# Patient Record
Sex: Male | Born: 1995 | Race: White | Hispanic: No | Marital: Single | Smoking: Current every day smoker
Health system: Southern US, Community
[De-identification: ages and names within clinical notes are randomized; demographics above are authoritative.]

## PROBLEM LIST (undated history)

## (undated) DIAGNOSIS — F1910 Other psychoactive substance abuse, uncomplicated: Secondary | ICD-10-CM

## (undated) DIAGNOSIS — R569 Unspecified convulsions: Secondary | ICD-10-CM

## (undated) HISTORY — DX: Unspecified convulsions: R56.9

---
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
## 2014-10-14 ENCOUNTER — Emergency Department (HOSPITAL_COMMUNITY): Payer: Managed Care, Other (non HMO)

## 2014-10-14 ENCOUNTER — Encounter (HOSPITAL_COMMUNITY): Payer: Self-pay | Admitting: Emergency Medicine

## 2014-10-14 ENCOUNTER — Emergency Department (HOSPITAL_COMMUNITY)
Admission: EM | Admit: 2014-10-14 | Discharge: 2014-10-14 | Disposition: A | Discharge status: Home / Self Care | Payer: Managed Care, Other (non HMO) | Attending: Emergency Medicine | Admitting: Emergency Medicine

## 2014-10-14 DIAGNOSIS — Y999 Unspecified external cause status: Secondary | ICD-10-CM | POA: Diagnosis not present

## 2014-10-14 DIAGNOSIS — Z792 Long term (current) use of antibiotics: Secondary | ICD-10-CM | POA: Diagnosis not present

## 2014-10-14 DIAGNOSIS — Y929 Unspecified place or not applicable: Secondary | ICD-10-CM | POA: Insufficient documentation

## 2014-10-14 DIAGNOSIS — W06XXXA Fall from bed, initial encounter: Secondary | ICD-10-CM | POA: Insufficient documentation

## 2014-10-14 DIAGNOSIS — R569 Unspecified convulsions: Secondary | ICD-10-CM | POA: Insufficient documentation

## 2014-10-14 DIAGNOSIS — Y939 Activity, unspecified: Secondary | ICD-10-CM | POA: Insufficient documentation

## 2014-10-14 DIAGNOSIS — G479 Sleep disorder, unspecified: Secondary | ICD-10-CM | POA: Insufficient documentation

## 2014-10-14 DIAGNOSIS — W19XXXA Unspecified fall, initial encounter: Secondary | ICD-10-CM

## 2014-10-14 DIAGNOSIS — Z720 Tobacco use: Secondary | ICD-10-CM | POA: Insufficient documentation

## 2014-10-14 DIAGNOSIS — M25511 Pain in right shoulder: Secondary | ICD-10-CM

## 2014-10-14 DIAGNOSIS — S4991XA Unspecified injury of right shoulder and upper arm, initial encounter: Secondary | ICD-10-CM | POA: Diagnosis not present

## 2014-10-14 HISTORY — DX: Unspecified convulsions: R56.9

## 2014-10-14 LAB — CBC
HCT: 41.5 % (ref 39.0–52.0)
Hemoglobin: 13.9 g/dL (ref 13.0–17.0)
MCH: 29.3 pg (ref 26.0–34.0)
MCHC: 33.5 g/dL (ref 30.0–36.0)
MCV: 87.4 fL (ref 78.0–100.0)
Platelets: 324 10*3/uL (ref 150–400)
RBC: 4.75 MIL/uL (ref 4.22–5.81)
RDW: 13 % (ref 11.5–15.5)
WBC: 10.1 10*3/uL (ref 4.0–10.5)

## 2014-10-14 LAB — BASIC METABOLIC PANEL
Anion gap: 9 (ref 5–15)
BUN: 9 mg/dL (ref 6–20)
CO2: 24 mmol/L (ref 22–32)
Calcium: 8.9 mg/dL (ref 8.9–10.3)
Chloride: 105 mmol/L (ref 101–111)
Creatinine, Ser: 0.83 mg/dL (ref 0.61–1.24)
GFR calc Af Amer: >60 mL/min (ref >60)
GFR calc non Af Amer: >60 mL/min (ref >60)
Glucose, Bld: 109 mg/dL — ABNORMAL HIGH (ref 65–99)
Potassium: 4.4 mmol/L (ref 3.5–5.1)
Sodium: 138 mmol/L (ref 135–145)

## 2014-10-14 NOTE — ED Notes (Addendum)
Pt reports he has been abusing xanax, IV heroin and has been snorting cocaine. Last use was 2 weeks ago.

## 2014-10-14 NOTE — ED Provider Notes (Signed)
CSN: [id number]     Arrival date & time 10/14/14  0009 History   First MD Initiated Contact with Patient 10/14/14 0047     This chart was scribed for Mirian Mo, MD by Arlan Organ, ED Scribe. This patient was seen in room B18C/B18C and the patient's care was started 1:27 AM.   Chief Complaint  Patient presents with  . Seizures   The history is provided by the patient. No language interpreter was used.    HPI Comments: Adrian Schmitt is a 19 y.o. male without any pertinent past medical history who presents to the Emergency Department here after a possible seizure this evening. Pt family found pt on the floor after he fell from his bed at approximately 11:30. Pt states he can not recall any incidents prior to falling asleep this evening. He now c/o constant ongoing R arm pain. No OTC medications or home remedies attempted prior to arrival. Last seizure 8 years ago. No recent fever, chills, nausea, cough, congestion, sore throat, or vomiting. He is not currently on any prescribed medications for history of seizures. Pt admits to abusing Xanax and IV heroin/Cocaine use, however not in presence of mother. Last use 2 weeks ago. No known allergies to medications.  Past Medical History  Diagnosis Date  . Seizures   . Seizure    History reviewed. No pertinent past surgical history. No family history on file. History  Substance Use Topics  . Smoking status: Current Every Day Smoker  . Smokeless tobacco: Not on file  . Alcohol Use: Yes    Review of Systems  Constitutional: Negative for fever and chills.  HENT: Negative for congestion and sore throat.   Respiratory: Negative for cough and shortness of breath.   Cardiovascular: Negative for chest pain.  Gastrointestinal: Negative for nausea and vomiting.  Musculoskeletal: Positive for arthralgias.  Neurological: Positive for seizures.  Psychiatric/Behavioral: Positive for sleep disturbance.  All other systems reviewed and are  negative.     Allergies  Ativan and Nembutal  Home Medications   Prior to Admission medications   Medication Sig Start Date End Date Taking? Authorizing Provider  silver sulfADIAZINE (SILVADENE) 1 % cream Apply 1 application topically daily. 08/16/13   Elvina Sidle, MD   Triage Vitals: BP 131/58 mmHg  Pulse 58  Temp(Src) 98.3 F (36.8 C) (Oral)  Resp 26  SpO2 100%   Physical Exam  Constitutional: He is oriented to person, place, and time. He appears well-developed and well-nourished.  HENT:  Head: Normocephalic and atraumatic.  Eyes: Conjunctivae and EOM are normal.  Neck: Normal range of motion. Neck supple.  Cardiovascular: Normal rate, regular rhythm and normal heart sounds.   Pulmonary/Chest: Effort normal and breath sounds normal. No respiratory distress.  Abdominal: He exhibits no distension. There is no tenderness. There is no rebound and no guarding.  Musculoskeletal: Normal range of motion.  Neurological: He is alert and oriented to person, place, and time. He has normal strength and normal reflexes. No cranial nerve deficit or sensory deficit. Coordination normal. GCS eye subscore is 4. GCS verbal subscore is 5. GCS motor subscore is 6.  Skin: Skin is warm and dry.  Vitals reviewed.   ED Course  Procedures (including critical care time)  DIAGNOSTIC STUDIES: Oxygen Saturation is 98% on RA, Normal by my interpretation.    COORDINATION OF CARE: 1:30 AM- Will order BMP and CBC. Discussed treatment plan with pt at bedside and pt agreed to plan.     Labs  Review Labs Reviewed  BASIC METABOLIC PANEL - Abnormal; Notable for the following:    Glucose, Bld 109 (*)    All other components within normal limits  CBC    Imaging Review Dg Shoulder Right  10/14/2014   CLINICAL DATA:  Right shoulder pain after falling out bed during a seizure  EXAM: RIGHT SHOULDER - 2+ VIEW  COMPARISON:  None.  FINDINGS: There is no evidence of fracture or dislocation. There is no  evidence of arthropathy or other focal bone abnormality. Soft tissues are unremarkable.  IMPRESSION: Negative.   Electronically Signed   By: Ellery Plunk M.D.   On: 10/14/2014 02:11   Dg Humerus Right  10/14/2014   CLINICAL DATA:  Right upper extremity pain after falling out of bed during a seizure  EXAM: RIGHT HUMERUS - 2+ VIEW  COMPARISON:  None.  FINDINGS: There is no evidence of fracture or other focal bone lesions. Soft tissues are unremarkable.  IMPRESSION: Negative.   Electronically Signed   By: Ellery Plunk M.D.   On: 10/14/2014 02:11     EKG Interpretation None      MDM   Final diagnoses:  Right shoulder pain  Fall, initial encounter    19 y.o. male with pertinent PMH of prior seizures presents with ams after fall from bed.  Pt does endorse ho IV drug abuse, however no recent fever or other illness.  Physical exam on arrival benign, no seizure like activity reported, and pt was only drowsy for a brief period of time after parents found him on the floor after a thump.  Wu unremarkable.  Likely recurrent seizure vs illicit substance abuse.  DC home to fu with neurology.    I have reviewed all laboratory and imaging studies if ordered as above  1. Fall, initial encounter   2. Right shoulder pain           Mirian Mo, MD 10/14/14 [phone]

## 2014-10-14 NOTE — ED Notes (Signed)
Pt.'s family found him on the floor fell from bed , ? Seizure episode , pt. stated he had " twitchings" when we went to bed this evening , alert and oriented at arrival / respirations unlabored , reports pain at right upper arm and left wrist.

## 2014-10-14 NOTE — ED Notes (Signed)
Pt A&OX4, ambulatory at d/c with steady gait, NAD 

## 2014-10-14 NOTE — Discharge Instructions (Signed)
Contusion A contusion is a deep bruise. Contusions are the result of an injury that caused bleeding under the skin. The contusion may turn blue, purple, or yellow. Minor injuries will give you a painless contusion, but more severe contusions may stay painful and swollen for a few weeks.  CAUSES  A contusion is usually caused by a blow, trauma, or direct force to an area of the body. SYMPTOMS   Swelling and redness of the injured area.  Bruising of the injured area.  Tenderness and soreness of the injured area.  Pain. DIAGNOSIS  The diagnosis can be made by taking a history and physical exam. An X-ray, CT scan, or MRI may be needed to determine if there were any associated injuries, such as fractures. TREATMENT  Specific treatment will depend on what area of the body was injured. In general, the best treatment for a contusion is resting, icing, elevating, and applying cold compresses to the injured area. Over-the-counter medicines may also be recommended for pain control. Ask your caregiver what the best treatment is for your contusion. HOME CARE INSTRUCTIONS   Put ice on the injured area.  Put ice in a plastic bag.  Place a towel between your skin and the bag.  Leave the ice on for 15-20 minutes, 3-4 times a day, or as directed by your health care provider.  Only take over-the-counter or prescription medicines for pain, discomfort, or fever as directed by your caregiver. Your caregiver may recommend avoiding anti-inflammatory medicines (aspirin, ibuprofen, and naproxen) for 48 hours because these medicines may increase bruising.  Rest the injured area.  If possible, elevate the injured area to reduce swelling. SEEK IMMEDIATE MEDICAL CARE IF:   You have increased bruising or swelling.  You have pain that is getting worse.  Your swelling or pain is not relieved with medicines. MAKE SURE YOU:   Understand these instructions.  Will watch your condition.  Will get help right  away if you are not doing well or get worse. Document Released: 01/30/2005 Document Revised: 04/27/2013 Document Reviewed: 02/25/2011 [facility] Patient Information 2015 [place], [place]. This information is not intended to replace advice given to you by your health care provider. Make sure you discuss any questions you have with your health care provider. Seizure, Adult A seizure is abnormal electrical activity in the brain. Seizures usually last from 30 seconds to 2 minutes. There are various types of seizures. Before a seizure, you may have a warning sensation (aura) that a seizure is about to occur. An aura may include the following symptoms:   Fear or anxiety.  Nausea.  Feeling like the room is spinning (vertigo).  Vision changes, such as seeing flashing lights or spots. Common symptoms during a seizure include:  A change in attention or behavior (altered mental status).  Convulsions with rhythmic jerking movements.  Drooling.  Rapid eye movements.  Grunting.  Loss of bladder and bowel control.  Bitter taste in the mouth.  Tongue biting. After a seizure, you may feel confused and sleepy. You may also have an injury resulting from convulsions during the seizure. HOME CARE INSTRUCTIONS   If you are given medicines, take them exactly as prescribed by your health care provider.  Keep all follow-up appointments as directed by your health care provider.  Do not swim or drive or engage in risky activity during which a seizure could cause further injury to you or others until your health care provider says it is OK.  Get adequate rest.  Teach  friends and family what to do if you have a seizure. They should:  Lay you on the ground to prevent a fall.  Put a cushion under your head.  Loosen any tight clothing around your neck.  Turn you on your side. If vomiting occurs, this helps keep your airway clear.  Stay with you until you recover.  Know whether or not you need  emergency care. SEEK IMMEDIATE MEDICAL CARE IF:  The seizure lasts longer than 5 minutes.  The seizure is severe or you do not wake up immediately after the seizure.  You have an altered mental status after the seizure.  You are having more frequent or worsening seizures. Someone should drive you to the emergency department or call local emergency services (911 in [place] MAKE SURE YOU:  Understand these instructions.  Will watch your condition.  Will get help right away if you are not doing well or get worse. Document Released: 04/19/2000 Document Revised: 02/10/2013 Document Reviewed: 12/02/2012 [facility] Patient Information 2015 [place], [place]. This information is not intended to replace advice given to you by your health care provider. Make sure you discuss any questions you have with your health care provider.

## 2014-10-14 NOTE — ED Notes (Signed)
Mother reports pt has a hx of seizures, his last seizure was 8 years ago and does not take medication nor does he see a doctor anymore for his seizures.

## 2015-03-13 ENCOUNTER — Emergency Department (HOSPITAL_COMMUNITY)
Admission: EM | Admit: 2015-03-13 | Discharge: 2015-03-13 | Disposition: A | Discharge status: Home / Self Care | Payer: Managed Care, Other (non HMO) | Attending: Emergency Medicine | Admitting: Emergency Medicine

## 2015-03-13 ENCOUNTER — Encounter (HOSPITAL_COMMUNITY): Payer: Self-pay | Admitting: Emergency Medicine

## 2015-03-13 DIAGNOSIS — Y998 Other external cause status: Secondary | ICD-10-CM | POA: Insufficient documentation

## 2015-03-13 DIAGNOSIS — Y9389 Activity, other specified: Secondary | ICD-10-CM | POA: Insufficient documentation

## 2015-03-13 DIAGNOSIS — Z720 Tobacco use: Secondary | ICD-10-CM | POA: Insufficient documentation

## 2015-03-13 DIAGNOSIS — Y9289 Other specified places as the place of occurrence of the external cause: Secondary | ICD-10-CM | POA: Diagnosis not present

## 2015-03-13 DIAGNOSIS — T401X1A Poisoning by heroin, accidental (unintentional), initial encounter: Secondary | ICD-10-CM | POA: Insufficient documentation

## 2015-03-13 HISTORY — DX: Other psychoactive substance abuse, uncomplicated: F19.10

## 2015-03-13 NOTE — ED Provider Notes (Signed)
CSN: [id number]     Arrival date & time 03/13/15  1349 History   First MD Initiated Contact with Patient 03/13/15 1500     Chief Complaint  Patient presents with  . Drug Overdose     (Consider location/radiation/quality/duration/timing/severity/associated sxs/prior Treatment) Patient is a 19 y.o. male presenting with Overdose.  Drug Overdose This is a new problem. The current episode started 1 to 2 hours ago. The problem occurs constantly. The problem has not changed since onset.Pertinent negatives include no chest pain, no abdominal pain, no headaches and no shortness of breath. Nothing aggravates the symptoms. Nothing relieves the symptoms.    Past Medical History  Diagnosis Date  . Seizures (HCC)   . Seizure (HCC)   . Polysubstance abuse    No past surgical history on file. No family history on file. Social History  Substance Use Topics  . Smoking status: Current Every Day Smoker  . Smokeless tobacco: None  . Alcohol Use: Yes    Review of Systems  Respiratory: Negative for shortness of breath.   Cardiovascular: Negative for chest pain.  Gastrointestinal: Negative for abdominal pain.  Neurological: Negative for headaches.  All other systems reviewed and are negative.     Allergies  Ativan and Nembutal  Home Medications   Prior to Admission medications   Medication Sig Start Date End Date Taking? Authorizing Provider  silver sulfADIAZINE (SILVADENE) 1 % cream Apply 1 application topically daily. Patient not taking: Reported on 03/13/2015 08/16/13   Elvina SidleKurt Lauenstein, MD   BP 144/73 mmHg  Pulse 102  Temp(Src) 97.9 F (36.6 C) (Axillary)  Resp 19  SpO2 100% Physical Exam  Constitutional: He is oriented to person, place, and time. He appears well-developed and well-nourished.  HENT:  Head: Normocephalic and atraumatic.  Eyes: Conjunctivae and EOM are normal.  Neck: Normal range of motion. Neck supple.  Cardiovascular: Normal rate, regular rhythm and normal  heart sounds.   Pulmonary/Chest: Effort normal and breath sounds normal. No respiratory distress.  Abdominal: He exhibits no distension. There is no tenderness. There is no rebound and no guarding.  Musculoskeletal: Normal range of motion.  Neurological: He is alert and oriented to person, place, and time.  Skin: Skin is warm and dry.  Vitals reviewed.   ED Course  Procedures (including critical care time) Labs Review Labs Reviewed - No data to display  Imaging Review No results found. I have personally reviewed and evaluated these images and lab results as part of my medical decision-making.   EKG Interpretation None      MDM   Final diagnoses:  Heroin overdose, accidental or unintentional, initial encounter    19 y.o. male with pertinent PMH of heroin abuse presents with overdose on heroin.  States he took more than usual.  Father heard the pt hit the ground.  Narcan alleviated symptoms.  On my exam pt drowsy, but not sleeping.  Exam benign.  Monitored 4 hours from administration of narcan without recurrence.  Parents to take pt home.  Standard return precautions given.  DC home in stable condition  I have reviewed all laboratory and imaging studies if ordered as above  1. Heroin overdose, accidental or unintentional, initial encounter         Mirian MoMatthew Gentry, MD 03/13/15 [phone]

## 2015-03-13 NOTE — Progress Notes (Addendum)
Pt noted to doze off and on when speaking with Cm Pt confirms [employer] coverage but not aware of where insurance card is Parents arrived in pt room [facility] ED CM noted pt with coverage  [facility] ED CM spoke with parrents on how to obtain an in network pcp with insurance coverage via the customer service number or web site  Cm reviewed ED level of care for crisis/emergent services and community pcp level of care to manage continuous or chronic medical concerns.  The pt voiced understanding CM encouraged pt and discussed pt's responsibility to verify with pt's insurance carrier that any recommended medical provider offered by any emergency room or a hospital provider is within the carrier's network. The pt voiced understanding   When CM left room and stated she hoped pt felt better, his mother snickered in a negative way.

## 2015-03-13 NOTE — Discharge Instructions (Signed)
Narcotic Overdose A narcotic overdose is the misuse or overuse of a narcotic drug. A narcotic overdose can make you pass out and stop breathing. If you are not treated right away, this can cause permanent brain damage or stop your heart. Medicine may be given to reverse the effects of an overdose. If so, this medicine may bring on withdrawal symptoms. The symptoms may be abdominal cramps, throwing up (vomiting), sweating, chills, and nervousness. Injecting narcotics can cause more problems than just an overdose. AIDS, hepatitis, and other very serious infections are transmitted by sharing needles and syringes. If you decide to quit using, there are medicines which can help you through the withdrawal period. Trying to quit all at once on your own can be uncomfortable, but not life-threatening. Call your caregiver, Narcotics Anonymous, or any drug and alcohol treatment program for further help.    This information is not intended to replace advice given to you by your health care provider. Make sure you discuss any questions you have with your health care provider.   Document Released: 05/30/2004 Document Revised: 05/13/2014 Document Reviewed: 10/12/2014 Elsevier Interactive Patient Education 2016 [employer]   Emergency Department Resource Guide 1) Find a Doctor and Pay Out of Pocket Although you won't have to find out who is covered by your insurance plan, it is a good idea to ask around and get recommendations. You will then need to call the office and see if the doctor you have chosen will accept you as a new patient and what types of options they offer for patients who are self-pay. Some doctors offer discounts or will set up payment plans for their patients who do not have insurance, but you will need to ask so you aren't surprised when you get to your appointment.  2) Contact Your Local Health Department Not all health departments have doctors that can see patients for sick visits, but many  do, so it is worth a call to see if yours does. If you don't know where your local health department is, you can check in your phone book. The CDC also has a tool to help you locate your state's health department, and many state websites also have listings of all of their local health departments.  3) Find a Walk-in Clinic If your illness is not likely to be very severe or complicated, you may want to try a walk in clinic. These are popping up all over the country in pharmacies, drugstores, and shopping centers. They're usually staffed by nurse practitioners or physician assistants that have been trained to treat common illnesses and complaints. They're usually fairly quick and inexpensive. However, if you have serious medical issues or chronic medical problems, these are probably not your best option.  No Primary Care Doctor: - Call Health Connect at  [phone] - they can help you locate a primary care doctor that  accepts your insurance, provides certain services, etc. - Physician Referral Service- [phone]  Chronic Pain Problems: Organization         Address  Phone   Notes  [facility]  [phone] Patients need to be referred by their primary care doctor.   Medication Assistance: Organization         Address  Phone   Notes  [facility] [address] [place], [place] [id number] [phone] --Must be a resident of [place] -- Must have NO insurance coverage whatsoever (no Medicaid/ Medicare, etc.) -- The pt.  MUST have a primary care doctor that directs their care regularly and follows them in the community   [facility]  [phone]   [employer]  [phone]    Agencies that provide inexpensive medical care: Organization         Address  Phone   Notes  [facility]  [phone]   [facility]    [phone]   [facility] [address] [place], [place] [address] [phone]   [facility] [address], [place] [phone]   Planned Parenthood    [phone]   [facility]    [phone]   [facility]  [address], [place] Phone:  [phone], Fax:  [phone] Hours of Operation:  9 am - 6 pm, M-F.  Also accepts Medicaid/Medicare and self-pay.  [facility]  [address], [place] Phone: [phone], Fax: [phone]. Hours of Operation:  8:30 am - 5:30 pm, M-F.  Also accepts Medicaid and self-pay.  [facility] [place] [address] [place] Phone: [phone]   [facility] [address] Natasha Bence [place], [place] [phone], Ext. [phone] Mondays & Thursdays: 7-9 AM.  First 15 patients are seen on a first come, first serve basis.    Medicaid-accepting [facility] Providers:  Organization         Address  Phone   Notes  [facility] [address], [place] [phone] Also accepts self-pay patients.  [facility] [address] Laurell Josephs [place], [place]  [phone]   [facility] [address], [place] [phone]   [facility] [address], [place] [phone]   Renaye Rakers [address], [place]   [phone] Only accepts [place] patients after they have their name applied to their card.   Self-Pay (no insurance) in [facility]:  Organization         Address  Phone   Notes  Sickle Cell Patients, [facility] [address] [place], [place] [phone]   [facility] [address] [place], [place] [phone]   [facility]  [address], [place] [phone]   [facility]/Dr. Osei-Bonsu  [address] [place] or  [address], [place] [phone] Phone number for both [place] and [place] locations is the same.  [facility] [address] [place] [phone]   [facility] [address], [place] or [address] [phone]   [facility] [address] [place] [phone], phone; [phone], fax Sees patients 1st and 3rd Saturday of every month.  Must not qualify for public or private insurance (i.e. Medicaid, Medicare, [place] Health Choice, Veterans' Benefits)  Household income should be no more than 200% of the poverty level The clinic cannot treat you if you are pregnant or think you are pregnant  Sexually transmitted diseases are not treated at the clinic.    Dental Care: Organization         Address  Phone  Notes  [facility] [address], [place] [phone] Accepts children up to age 21 who are enrolled in Medicaid or [place] Health Choice; pregnant women with a Medicaid card; and children who have applied for Medicaid or [place] Health Choice, but were declined, whose parents can pay a reduced fee at time of service.  °[place] [facility] [place]  [address], [place] [phone]) [phone] Accepts children up to age 21 who are enrolled in Medicaid or [place] Health Choice; pregnant women with a Medicaid card; and children who have applied for Medicaid or [place] Health Choice, but were declined, whose parents can pay a reduced fee at time of service.  °[facility] ° [address], [place] [phone] Patients are seen by appointment only. Walk-ins are not accepted. [facility] will see patients 18 years of age and older. °Monday - Tuesday (8am-5pm) °Most Wednesdays (8:30-5pm) °$30 per visit, cash only  °[facility] ° [address], [place] [phone]) [phone] Patients are seen by appointment only. Walk-ins are not accepted. [facility] will see patients 18 years of age and older. °One Wednesday Evening (Monthly: Volunteer Based).  $30 per visit, cash only  °[facility]  [phone] for adults; Children under age 4, call Graduate Pediatric Dentistry at [phone]. Children aged 4-14, please call [phone] to request a pediatric application. ° Dental services are provided in all areas of dental care including fillings, crowns and bridges, complete and partial dentures, implants, gum treatment, root canals, and extractions. Preventive care is also provided. Treatment is provided to both adults and children. °Patients are selected via a lottery and there is often a waiting list. °  °[facility] [address], °[place] ° (336) 763-8833 [email or web address] °  °[facility] [address], [place], [place] (336)[phone], Ext. [phone] Second and Fourth Thursday of each month, opens at 6:30 AM; Clinic ends at 9 AM.  Patients are seen on a first-come first-served basis, and a limited number are seen during each clinic.  ° °[facility] ° [address], [place], [place] ([phone]) [phone]   Eligibility Requirements °You must have lived in [place], [place], or [place] counties for at least the last three months. °  You cannot be eligible for state or federal sponsored healthcare insurance, including Veterans Administration, Medicaid, or Medicare. °  You generally cannot be eligible for healthcare insurance through your employer.  °  How to apply: °Eligibility screenings are held every Tuesday and Wednesday afternoon from 1:00 pm until 4:00 pm. You do not need an appointment for the interview!  °[facility] [address], [place], [place] [phone]   °[facility]  336-342-8273   °[place]  336-703-3100   °[place]  [facility]  336-570-6415   ° °Behavioral Health Resources in the Community: °Intensive Outpatient Programs °Organization         Address  Phone  Notes  °[facility] [address], [place], [place] 336-878-6098   °[facility] [address], [place], [place] 336-832-9800   °[facility] [address], [place], [place] ° 336-882-2125   °[facility] [address],  °[place], [place] 1-800-853-5163 or 336-641-4981   °Substance Abuse Resources °Organization         Address  Phone  Notes  °[facility]   [facility]  336-882-2125   °[facility]  336-784-9470   °[facility]  336-285-9073   °[facility]  336-845-3988   °Residential & Outpatient Substance Abuse Program  1-800-659-3381   °Psychological Services °Organization         Address  Phone  Notes  °[facility]  336- 832-9600   °[facility]  336- 378-7881   °[facility] [address], [place] 1-800-853-5163 or 336-641-4981   ° °Mobile Crisis Teams °Organization         Address  Phone  Notes  °[facility], Mobile Crisis Care Unit  1-877-626-1772   °Assertive °[facility] ° [address] [place], [place] 336-834-9664   °Sharon DeEsch [address] °[place] 336-554-5454   ° °Self-Help/Support Groups °Organization         Address  Phone             Notes  °[facility] [place] - variety of support groups  336- 373-1402 Call for more information  °Narcotics Anonymous (NA), [facility] [address], °[place]  2 meetings at this location  ° °Residential Treatment Programs °Organization         Address  Phone  Notes  °[facility] [address],    °[place]  1-866-801-8205   °[facility] ° [address], [place], [place] 704-293-8524   °[facility] [address], [place] 336-845-3988  Admissions: 8am-3pm M-F  °[facility] [address]    °[place], [place] 336-841-1104   °[facility] [address], [place], [place] 336-379-7146   °[facility] [address]  °[place], [place] 336-285-9073   °[facility] Intensive Outpatient [address], [place], [place] 336-852-3033   °[facility] [address]  °[place], [place] 1-877-615-2722 or 336-784-9470   °[facility] ([facility]) [address] [place], [place] 336-227-7417 Accepts Medicaid  °[facility] [address]  ° [place] 1-800-659-3381 Substance Abuse/Addiction Treatment  ° °[place] [facility] Resources °Organization         Address  Phone  Notes  °[facility]  (888) 581-9988   °Julie Brannon, PhD [address] [place], [place]   (336) 349-5553 or (336) 951-0000   °[facility]   [address] °[place], [place] (336) 349-4454   °[facility] [address], [place], [place] (336) 342-8316 Insurance/Medicaid/sponsorship through [facility]  °[facility] [address]                                    [place], [place] (336) 342-8316 Therapy/tele-psych/case  °[facility] [address]  ° [place], [place] (336) 349-2233    °Dr. Arfeen  (336) 349-4544   °[facility] [place]  [employer] [place] [facility] 1) [address], [place] °2) [address], [place] °3)  [address], [place] (336) 349-3220 °(336) 342-7768 ° °(336) 342-8140   °[place] [facility] (336) 342-1394 or (336) 342-3537 (After Hours)    ° ° ° °

## 2015-03-13 NOTE — ED Notes (Signed)
[facility] Expected date:  Expected time:  Means of arrival:  Comments: Heroin OD, assisted ventilations, A&O x4 now

## 2015-03-13 NOTE — ED Notes (Signed)
Bed: WA07 Expected date:  Expected time:  Means of arrival:  Comments: Hold for hall A

## 2015-03-13 NOTE — ED Notes (Signed)
Per EMS: Pt from home.  Dad heard a thump, found him on the floor, struck his face on the desk he was sitting at.  Had a bloody nose from the fall.  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  Found unresponsive, apneic, cyanotic.  Assisted ventilations w/ BVM.  2 mg narcan intranasally.  20 g in rt forearm.  A&O x 4.  Ambulatory to stretcher per his request.  States he started smoking weed when he was 14, began drinking etoh, lost his license at [age].  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  Went to rehab in July.  Dad knows of 3-4 times in the last week when he wasn't acting right.  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 Has been detoxing "really hard" this last week.  Shot up heroin on Wednesday.  Smoked some on Thursday.  Had a lot that he needed to get done and shot up today in order to help with the withdrawals he was feeling.

## 2015-04-03 ENCOUNTER — Encounter (HOSPITAL_COMMUNITY): Payer: Self-pay | Admitting: Emergency Medicine

## 2015-04-03 ENCOUNTER — Emergency Department (HOSPITAL_COMMUNITY)
Admission: EM | Admit: 2015-04-03 | Discharge: 2015-04-03 | Disposition: A | Discharge status: Home / Self Care | Payer: Managed Care, Other (non HMO) | Attending: Emergency Medicine | Admitting: Emergency Medicine

## 2015-04-03 DIAGNOSIS — Z79899 Other long term (current) drug therapy: Secondary | ICD-10-CM | POA: Diagnosis not present

## 2015-04-03 DIAGNOSIS — T401X1A Poisoning by heroin, accidental (unintentional), initial encounter: Secondary | ICD-10-CM | POA: Insufficient documentation

## 2015-04-03 DIAGNOSIS — Y9389 Activity, other specified: Secondary | ICD-10-CM | POA: Diagnosis not present

## 2015-04-03 DIAGNOSIS — Y9289 Other specified places as the place of occurrence of the external cause: Secondary | ICD-10-CM | POA: Insufficient documentation

## 2015-04-03 DIAGNOSIS — F17200 Nicotine dependence, unspecified, uncomplicated: Secondary | ICD-10-CM | POA: Diagnosis not present

## 2015-04-03 DIAGNOSIS — Y998 Other external cause status: Secondary | ICD-10-CM | POA: Diagnosis not present

## 2015-04-03 NOTE — Discharge Instructions (Signed)
Narcotic Overdose A narcotic overdose is the misuse or overuse of a narcotic drug. A narcotic overdose can make you pass out and stop breathing. If you are not treated right away, this can cause permanent brain damage or stop your heart. Medicine may be given to reverse the effects of an overdose. If so, this medicine may bring on withdrawal symptoms. The symptoms may be abdominal cramps, throwing up (vomiting), sweating, chills, and nervousness. Injecting narcotics can cause more problems than just an overdose. AIDS, hepatitis, and other very serious infections are transmitted by sharing needles and syringes. If you decide to quit using, there are medicines which can help you through the withdrawal period. Trying to quit all at once on your own can be uncomfortable, but not life-threatening. Call your caregiver, Narcotics Anonymous, or any drug and alcohol treatment program for further help.    This information is not intended to replace advice given to you by your health care provider. Make sure you discuss any questions you have with your health care provider.   Document Released: 05/30/2004 Document Revised: 05/13/2014 Document Reviewed: 10/12/2014 Elsevier Interactive Patient Education 2016 [employer]  Emergency Department Resource Guide 1) Find a Doctor and Pay Out of Pocket Although you won't have to find out who is covered by your insurance plan, it is a good idea to ask around and get recommendations. You will then need to call the office and see if the doctor you have chosen will accept you as a new patient and what types of options they offer for patients who are self-pay. Some doctors offer discounts or will set up payment plans for their patients who do not have insurance, but you will need to ask so you aren't surprised when you get to your appointment.  2) Contact Your Local Health Department Not all health departments have doctors that can see patients for sick visits, but many do,  so it is worth a call to see if yours does. If you don't know where your local health department is, you can check in your phone book. The CDC also has a tool to help you locate your state's health department, and many state websites also have listings of all of their local health departments.  3) Find a Walk-in Clinic If your illness is not likely to be very severe or complicated, you may want to try a walk in clinic. These are popping up all over the country in pharmacies, drugstores, and shopping centers. They're usually staffed by nurse practitioners or physician assistants that have been trained to treat common illnesses and complaints. They're usually fairly quick and inexpensive. However, if you have serious medical issues or chronic medical problems, these are probably not your best option.  No Primary Care Doctor: - Call Health Connect at  [phone] - they can help you locate a primary care doctor that  accepts your insurance, provides certain services, etc. - Physician Referral Service- [phone]  Chronic Pain Problems: Organization         Address  Phone   Notes  [facility]  [phone] Patients need to be referred by their primary care doctor.   Medication Assistance: Organization         Address  Phone   Notes  [facility] [address] [place], [place] [address] [phone]) [phone] --Must be a resident of [facility] [place] -- Must have NO insurance coverage whatsoever (no Medicaid/ Medicare, etc.) -- The pt. MUST  pt. MUST have a primary care doctor that directs their care regularly and follows them in the community °  °MedAssist  (866) 331-1348   °United Way  (888) 892-1162   ° °Agencies that provide inexpensive medical care: °Organization         Address  Phone   Notes  °[facility]  (336) 832-8035   °[facility]    (336) 832-7272   °[facility] [address] °[place], [place] [address] (336) 832-4777   °[facility] [address], °[place] (336) 271-4999   °[facility]    (336) 373-0678   °[facility]    (336) 272-1050   °[facility] ° [address], [place] Phone:  (336) 832-4444, Fax:  (336) 832-4440 Hours of Operation:  9 am - 6 pm, M-F.  Also accepts Medicaid/Medicare and self-pay.  °[facility] ° [address], [place] Phone: (336) 832-3150, Fax: (336) [phone]. Hours of Operation:  8:30 am - 5:30 pm, M-F.  Also accepts Medicaid and self-pay.  °[facility] [place] [address], [place] Phone: (336) 878-6027   °[facility] [address], [place], [place] (336)723-1848, Ext. 123 Mondays & Thursdays: 7-9 AM.  First 15 patients are seen on a first come, first serve basis. °  ° °Medicaid-accepting [place] Providers: ° °Organization         Address  Phone   Notes  °[facility] [address], [place] (336) [phone] Also accepts self-pay patients.  °[facility] [address], [place] ° (336) 856-9996   °[facility] [address], [place] (336) 288-8857   °[facility] [address] [place] [address], [place] (336) 299-7000   °Veita Bland [address], [place]  ° (336) [phone] Only accepts [place] [facility] Medicaid patients after they have their name applied to their card.  ° °Self-Pay (no insurance) in [place]: ° °Organization         Address  Phone   Notes  °Sickle Cell Patients, [facility] [address], [place] (336) 832-1970   °[facility] [address], [place] (336) 832-4400   °[facility] ° [address], [place] (336) 992-4800   °[facility]/Dr. Osei-Bonsu ° [address] [place] [address], [place] or  [address], [place] (336) [phone] Phone number for both [place] and [place] locations is the same.  °[facility] [address], [place] (336) 299-0000   °[facility] [address] [place] [address], [place] or [address] (336) 852-7530 °(336) 878-2260   °[facility] [address], [place] (336) 350-1642, phone; (336) 294-5005, fax Sees patients 1st and 3rd Saturday of every month.  Must not qualify for public or private insurance (i.e. Medicaid, Medicare, [place] Health Choice, Veterans' Benefits) • Household income should be no more than 200% of the poverty level •The clinic cannot treat you if you are pregnant or think you are pregnant • Sexually transmitted diseases are not treated at the clinic.  ° ° °Dental Care: °Organization         Address  Phone  Notes  °[facility] [address], [place] (336) 641-6152 Accepts children up to age 21 who are enrolled in Medicaid or [place] Health Choice; pregnant women with a Medicaid card; and children who have applied for Medicaid or [place] Health Choice, but were declined, whose parents can pay a reduced fee at time of service.  °[facility] [place]  [address], [place] (336) 641-7733 Accepts children up to age 21 who are enrolled in Medicaid or [place] Health Choice; pregnant women with a Medicaid card; and children who have applied for Medicaid or [place] Health Choice, but were declined, whose parents can pay a reduced fee at time of service.  °[facility] ° [address], [place] (336) 641-4533 Patients are seen by appointment only. Walk-ins are not accepted. [facility] will see patients 18 years of age and older. °Monday - Tuesday (8am-5pm) °Most Wednesdays (8:30-5pm) °$30 per visit, cash only  °[facility] ° [address], [place] (336) 641-4533 Patients are seen by appointment only. Walk-ins are not accepted. [facility] will see patients 18 years of age and older. °One Wednesday Evening (Monthly: Volunteer Based).  $30 per visit, cash only  °[facility]  (919) 537-3737 for adults; Children under age 4, call Graduate Pediatric Dentistry at ([phone]) [phone]. Children aged 4-14, please call ([phone]) [phone] to request a pediatric application. ° Dental services are provided in all areas of dental care including fillings, crowns and bridges, complete and partial dentures, implants, gum treatment, root canals, and extractions. Preventive care is also provided. Treatment is provided to both adults and children. °Patients are selected via a lottery and there is often a waiting list. °  °[facility] [address], °[place] ° (336) 763-8833 [email or web address] °  °[facility] [address], [place], [place] (336)723-1848, Ext. 123 Second and Fourth Thursday of each month, opens at 6:30 AM; Clinic ends at 9 AM.  Patients are seen on a first-come first-served basis, and a limited number are seen during each clinic.  ° °[facility] ° [address], [place], [place] (336) 723-7904   Eligibility Requirements °You must have lived in [place], or [place] counties for at least the last three months. °  You cannot be eligible for state or federal sponsored healthcare insurance, including Veterans Administration, Medicaid, or Medicare. °  You generally cannot be eligible for healthcare insurance through your employer.  °  How to apply: °Eligibility screenings are held every Tuesday and Wednesday afternoon from 1:00 pm until 4:00 pm. You do not need an appointment for the interview!  °[facility] [address], [place], [place] 336-631-2330   °[facility]  336-342-8273   °[facility]  336-703-3100   ° County  Health Department  336-570-6415   ° °Behavioral Health Resources in the Community: °Intensive Outpatient Programs °Organization         Address  Phone  Notes  °[facility] [address], [place], [place] 336-878-6098   °[facility] [address], [place], [place] 336-832-9800   °[facility] [address], [place], [place] ° 336-882-2125   °[facility] [address],  °[place], [place] 1-800-853-5163 or 336-641-4981   °Substance Abuse Resources °Organization         Address  Phone  Notes  °[facility]  336-882-2125   °[facility]  336-784-9470   °[facility]  336-285-9073   °[facility]  336-845-3988   °Residential & Outpatient Substance Abuse Program  1-800-659-3381   °Psychological Services °Organization         Address  Phone  Notes  °[facility]  336- 832-9600   °[facility]  336- 378-7881   °[facility] [address], [place] 1-800-853-5163 or 336-641-4981   ° °Mobile Crisis Teams °Organization         Address  Phone  Notes  °[facility], Mobile Crisis Care Unit  1-877-626-1772   °Assertive °[facility] ° [address] [place], [place] 336-834-9664   °Sharon DeEsch [address] °[place] 336-554-5454   ° °Self-Help/Support Groups °Organization         Address  Phone             Notes  °[facility] - variety of support groups  336- 373-1402 Call for more information  °Narcotics Anonymous (NA), [facility] [address], °[place]  2 meetings at this location  ° °Residential Treatment Programs °Organization         Address  Phone  Notes  °[facility] [address],    °[place]  1-866-801-8205   °[facility] ° [address], [place], [place] 704-293-8524   °[facility] [address], [place] 336-845-3988  Admissions: 8am-3pm M-F  °[facility] [address]    °[place], [place] 336-841-1104   °[facility] [address], [place], [place] 336-379-7146   °[facility] [address]  °[place], [place] 336-285-9073   °[facility] - Intensive Outpatient [address], [place], [place] 336-852-3033   °[facility] [address]  °[place], [place] 1-877-615-2722 or 336-784-9470   °[facility] ([facility]) [address] [place], [place] 336-227-7417 Accepts Medicaid  °[facility] [address]  °[place] 1-800-659-3381 Substance Abuse/Addiction Treatment  ° °[place] Behavioral Health Resources °Organization         Address  Phone  Notes  °[facility]  (888) 581-9988   °Julie Brannon, PhD [address] [place], [place]   (336) 349-5553 or (336) 951-0000   °[facility]   [address] °[place], [place] (336) 349-4454   °[facility] [address], [place], [place] (336) 342-8316 Insurance/Medicaid/sponsorship through [facility]  °[facility] [address]                                    [place], [place] (336) 342-8316 Therapy/tele-psych/case  °[facility] [address]  ° [place], [place] (336) 349-2233    °Dr. Arfeen  (336) 349-4544   °[facility] [place]  [employer] [place] [facility] 1) [address], [place] °2) [address], [place] °3)  [address], [place] (336) 349-3220 °(336) 342-7768 ° °(336) 342-8140   °[place] [facility] (336) 342-1394 or (336) 342-3537 (After Hours)    ° ° ° °

## 2015-04-03 NOTE — ED Notes (Signed)
Bed: [id number] Expected date: 04/03/15 Expected time: 7:42 PM Means of arrival: Ambulance Comments: 19 yo M  Heroin overdose

## 2015-04-03 NOTE — ED Provider Notes (Signed)
CSN: [id number]     Arrival date & time 04/03/15  2005 History   First MD Initiated Contact with Patient 04/03/15 2028     No chief complaint on file.    (Consider location/radiation/quality/duration/timing/severity/associated sxs/prior Treatment) HPI Patient reports he accidentally overdosed on some heroin about 4 hours ago. He reports he got some Narcan and now he has no symptoms and he feels fine. He denies any pain, difficulty breathing or other associated symptoms. He reports he is ready to leave. Past Medical History  Diagnosis Date  . Seizures (HCC)   . Seizure (HCC)   . Polysubstance abuse    History reviewed. No pertinent past surgical history. No family history on file. Social History  Substance Use Topics  . Smoking status: Current Every Day Smoker  . Smokeless tobacco: None  . Alcohol Use: Yes    Review of Systems 10 Systems reviewed and are negative for acute change except as noted in the HPI.    Allergies  Ativan and Nembutal  Home Medications   Prior to Admission medications   Medication Sig Start Date End Date Taking? Authorizing Provider  Melatonin 3 MG TABS Take 1 tablet by mouth at bedtime.   Yes Historical Provider, MD  SUBOXONE 12-3 MG FILM DISSOLVE HALF A FILM UNDER THE TONGUE 2 TIMES DAILY 03/15/15  Yes Historical Provider, MD  silver sulfADIAZINE (SILVADENE) 1 % cream Apply 1 application topically daily. Patient not taking: Reported on 03/13/2015 08/16/13   Elvina SidleKurt Lauenstein, MD   BP 138/67 mmHg  Pulse 99  Temp(Src) 98.1 F (36.7 C) (Oral)  Resp 22  SpO2 100% Physical Exam  Constitutional: He is oriented to person, place, and time.  As I enter the room, the patient is up walking around dressed and packing his bags. Color is good. No rest or distress. Status is clear.  HENT:  Head: Normocephalic and atraumatic.  Eyes: EOM are normal.  Cardiovascular: Normal rate, regular rhythm, normal heart sounds and intact distal pulses.   Pulmonary/Chest:  Effort normal and breath sounds normal.  Musculoskeletal: Normal range of motion.  Patient is ambulating with a coordinated gait and packing his bags.  Neurological: He is alert and oriented to person, place, and time. He exhibits normal muscle tone. Coordination normal.  Skin: Skin is warm and dry.  Psychiatric: He has a normal mood and affect.    ED Course  Procedures (including critical care time) Labs Review Labs Reviewed - No data to display  Imaging Review No results found. I have personally reviewed and evaluated these images and lab results as part of my medical decision-making.   EKG Interpretation None      MDM   Final diagnoses:  Heroin overdose, accidental or unintentional, initial encounter   Immediately as I entered the room, the patient reports that all of his symptoms are resolved and he is ready to go. He was up and ambulating and there were no signs of distress. By his report this to be proximally 4 hours after the overdose and at this point I do not expect rebound of opioid effect. He is given resources for chronic abuse.    Arby BarretteMarcy Kiarra Kidd, MD 04/03/15 [id number]

## 2015-04-03 NOTE — ED Notes (Signed)
Per EMS, [facility]  Firefighters gave 2 MG of Narcan intranasal.  Patient admitted using heroin.  Patient denies any other complaints.   BP: 130/84 HR: 106 R:16  CBG:141

## 2015-08-18 ENCOUNTER — Encounter (HOSPITAL_COMMUNITY): Payer: Self-pay | Admitting: Emergency Medicine

## 2015-08-18 ENCOUNTER — Emergency Department (HOSPITAL_COMMUNITY)
Admission: EM | Admit: 2015-08-18 | Discharge: 2015-08-18 | Disposition: A | Discharge status: Home / Self Care | Payer: Managed Care, Other (non HMO) | Attending: Emergency Medicine | Admitting: Emergency Medicine

## 2015-08-18 ENCOUNTER — Encounter (HOSPITAL_COMMUNITY): Payer: Self-pay

## 2015-08-18 ENCOUNTER — Emergency Department (HOSPITAL_COMMUNITY)
Admission: EM | Admit: 2015-08-18 | Discharge: 2015-08-18 | Disposition: A | Discharge status: Home / Self Care | Payer: Managed Care, Other (non HMO) | Source: Home / Self Care | Attending: Emergency Medicine | Admitting: Emergency Medicine

## 2015-08-18 DIAGNOSIS — Z139 Encounter for screening, unspecified: Secondary | ICD-10-CM

## 2015-08-18 DIAGNOSIS — Z008 Encounter for other general examination: Secondary | ICD-10-CM

## 2015-08-18 DIAGNOSIS — F17200 Nicotine dependence, unspecified, uncomplicated: Secondary | ICD-10-CM

## 2015-08-18 DIAGNOSIS — Z0289 Encounter for other administrative examinations: Secondary | ICD-10-CM | POA: Insufficient documentation

## 2015-08-18 DIAGNOSIS — Z79899 Other long term (current) drug therapy: Secondary | ICD-10-CM

## 2015-08-18 LAB — RAPID URINE DRUG SCREEN, HOSP PERFORMED
Amphetamines: NOT DETECTED
Barbiturates: NOT DETECTED
Benzodiazepines: NOT DETECTED
Cocaine: NOT DETECTED
Opiates: NOT DETECTED
Tetrahydrocannabinol: NOT DETECTED

## 2015-08-18 NOTE — ED Notes (Signed)
Patient was seen here early for UDS and states his job is requires a blood test for Drug screening.

## 2015-08-18 NOTE — ED Notes (Signed)
Pt is in stable condition upon d/c and ambulates from ED. 

## 2015-08-18 NOTE — ED Provider Notes (Signed)
CSN: [id number]     Arrival date & time 08/18/15  1635 History  By signing my name below, I, Placido Sou, attest that this documentation has been prepared under the direction and in the presence of [employer], PA-C. Electronically Signed: Placido Sou, ED Scribe. 08/18/2015. 5:14 PM.   Chief Complaint  Patient presents with  . Drug / Alcohol Assessment   The history is provided by the patient. No language interpreter was used.    HPI Comments: Adrian Schmitt is a 20 y.o. male with a PMHx of heroin abuse who presents to the Emergency Department requesting a drug screening. Pt currently resides in rehabilitation housing and due to them believing his recent behavior was abnormal was asked to have a drug screening performed. He denies taking any current medications or any recent narcotic or ETOH use. He denies any current physical complaints.    Past Medical History  Diagnosis Date  . Seizures (HCC)   . Seizure (HCC)   . Polysubstance abuse    History reviewed. No pertinent past surgical history. No family history on file. Social History  Substance Use Topics  . Smoking status: Current Every Day Smoker  . Smokeless tobacco: None  . Alcohol Use: Yes    Review of Systems  Psychiatric/Behavioral: Negative for behavioral problems, confusion, dysphoric mood, decreased concentration and agitation.   Allergies  Ativan and Nembutal  Home Medications   Prior to Admission medications   Medication Sig Start Date End Date Taking? Authorizing Provider  Melatonin 3 MG TABS Take 1 tablet by mouth at bedtime.    Historical Provider, MD  silver sulfADIAZINE (SILVADENE) 1 % cream Apply 1 application topically daily. Patient not taking: Reported on 03/13/2015 08/16/13   Elvina Sidle, MD  SUBOXONE 12-3 MG FILM DISSOLVE HALF A FILM UNDER THE TONGUE 2 TIMES DAILY 03/15/15   Historical Provider, MD   BP 128/74 mmHg  Pulse 72  Temp(Src) 97.8 F (36.6 C) (Oral)  Resp 16  SpO2 98%     Physical Exam  Constitutional: He is oriented to person, place, and time. He appears well-developed and well-nourished.  HENT:  Head: Normocephalic and atraumatic.  Eyes: Conjunctivae are normal.  Neck: Normal range of motion.  Cardiovascular: Normal rate, regular rhythm and normal heart sounds.  Exam reveals no gallop and no friction rub.   No murmur heard. Pulmonary/Chest: Effort normal and breath sounds normal. No respiratory distress. He has no wheezes. He has no rales. He exhibits no tenderness.  Abdominal: Soft.  Musculoskeletal: Normal range of motion.  Neurological: He is alert and oriented to person, place, and time.  Skin: Skin is warm and dry.  Psychiatric: He has a normal mood and affect.  Nursing note and vitals reviewed.   ED Course  Procedures  DIAGNOSTIC STUDIES: Oxygen Saturation is 98% on RA, normal by my interpretation.    COORDINATION OF CARE: 5:11 PM Discussed next steps with pt. He verbalized understanding and is agreeable with the plan.   Labs Review Labs Reviewed  URINE RAPID DRUG SCREEN, HOSP PERFORMED    Imaging Review No results found. I have personally reviewed and evaluated these lab results as part of my medical decision-making.   EKG Interpretation None      MDM   Final diagnoses:  None  Patient presents today requesting a Urine Drug Screen.  He states that the rehab house that he is living in is requesting one.  UDS is negative. Patient asymptomatic.  Stable for discharge.  Return  precautions given.  I personally performed the services described in this documentation, which was scribed in my presence. The recorded information has been reviewed and is accurate.   Santiago GladHeather Keontay Vora, PA-C 08/18/15 2039  Mancel BaleElliott [place], MD 08/18/15 2322

## 2015-08-18 NOTE — ED Notes (Signed)
No c/o anything, simply needs a UDS.

## 2015-08-18 NOTE — Discharge Instructions (Signed)
Read the information below.  You may return to the Emergency Department at any time for worsening condition or any new symptoms that concern you. °

## 2015-08-18 NOTE — ED Notes (Addendum)
Pt reports "I just need a drug test for the house I live in." Hx of heroin abuse and lives in rehab housing. He reports he has been sober/clean for a month.
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
## 2015-08-18 NOTE — Discharge Instructions (Signed)
Return to the Emergency Department as needed if you develop any symptoms concerning to you.

## 2015-08-18 NOTE — ED Notes (Signed)
Called lab to inquire about UDS results.

## 2016-03-06 ENCOUNTER — Ambulatory Visit (INDEPENDENT_AMBULATORY_CARE_PROVIDER_SITE_OTHER): Payer: Managed Care, Other (non HMO) | Admitting: Physician Assistant

## 2016-03-06 VITALS — BP 126/84 | HR 92 | Temp 98.3°F | Resp 17 | Ht 72.0 in | Wt 172.0 lb

## 2016-03-06 DIAGNOSIS — Z7251 High risk heterosexual behavior: Secondary | ICD-10-CM

## 2016-03-06 DIAGNOSIS — R369 Urethral discharge, unspecified: Secondary | ICD-10-CM | POA: Diagnosis not present

## 2016-03-06 LAB — POCT URINALYSIS DIP (MANUAL ENTRY)
Bilirubin, UA: NEGATIVE
Blood, UA: NEGATIVE
Glucose, UA: NEGATIVE
Ketones, POC UA: NEGATIVE
Nitrite, UA: NEGATIVE
Protein Ur, POC: NEGATIVE
Spec Grav, UA: 1.025
Urobilinogen, UA: 0.2
pH, UA: 6

## 2016-03-06 MED ORDER — CEFTRIAXONE SODIUM 250 MG IJ SOLR
250.0000 mg | Freq: Once | INTRAMUSCULAR | Status: AC
  Administered 2016-03-06: 250 mg via INTRAMUSCULAR

## 2016-03-06 MED ORDER — AZITHROMYCIN 250 MG PO TABS: ORAL_TABLET | ORAL | 0 refills | Status: DC

## 2016-03-06 NOTE — Progress Notes (Signed)
03/06/2016 5:15 PM   DOB: 19-Feb-1996 / MRN: [id number]  SUBJECTIVE:  Adrian Schmitt is a 20 y.o. male presenting for dysuria that started 2-3 days ago.  Reports this is worsening. Has had 4 new sexual partners in the last month.  Has used protection with vagnial sex but has had unprotected oral sex. Denies low back pain or pain with defecation.  No belly pain today.   He is allergic to ativan [lorazepam] and nembutal [pentobarbital].   He  has a past medical history of Polysubstance abuse; Seizure (HCC); and Seizures (HCC).    He  reports that he has been smoking.  He does not have any smokeless tobacco history on file. He reports that he drinks alcohol. He reports that he uses drugs, including Cocaine and IV. He  has no sexual activity history on file. The patient  has no past surgical history on file.  His family history is not on file.  Review of Systems  Constitutional: Positive for malaise/fatigue. Negative for diaphoresis.  Gastrointestinal: Negative for nausea.  Genitourinary: Positive for dysuria. Negative for flank pain, frequency, hematuria and urgency.  Skin: Negative for rash.  Neurological: Negative for dizziness and headaches.    The problem list and medications were reviewed and updated by myself where necessary and exist elsewhere in the encounter.   OBJECTIVE:  BP 126/84 (BP Location: Right Arm, Patient Position: Sitting, Cuff Size: Normal)   Pulse 92   Temp 98.3 F (36.8 C) (Oral)   Resp 17   Ht 6' (1.829 m)   Wt 172 lb (78 kg)   SpO2 99%   BMI 23.33 kg/m   Physical Exam  Constitutional: He appears well-developed and well-nourished.  HENT:  Mouth/Throat: No oropharyngeal exudate.  Cardiovascular: Normal rate and regular rhythm.   Pulmonary/Chest: Effort normal and breath sounds normal.  Abdominal: Hernia confirmed negative in the right inguinal area and confirmed negative in the left inguinal area.  Genitourinary: Testes normal.    Right testis shows  no mass and no tenderness. Left testis shows no mass and no tenderness. No phimosis, paraphimosis, hypospadias, penile erythema or penile tenderness. Discharge found.  Lymphadenopathy:       Right: No inguinal adenopathy present.       Left: No inguinal adenopathy present.    No results found for this or any previous visit (from the past 72 hour(s)).  No results found.  ASSESSMENT AND PLAN  Adrian Schmitt was seen today for dysuria and penile discharge.  Diagnoses and all orders for this visit:  Penile discharge: He has physical findings along with problem two.  I am going to treat him for gonorrhea and chlamydia today.  Will screen for other possibilities. -     cefTRIAXone (ROCEPHIN) injection 250 mg; Inject 250 mg into the muscle once. -     GC/Chlamydia Probe Amp -     POCT urinalysis dipstick -     Trichomonas vaginalis RNA, Ql,Males -     RPR -     HIV antibody       -     azithromycin (ZITHROMAX) 250 MG tablet; Take four tabs once with food.  Risky sexual behavior: See problem one.      The patient is advised to call or return to clinic if he does not see an improvement in symptoms, or to seek the care of the closest emergency department if he worsens with the above plan.   Deliah BostonMichael Keondria Siever, MHS, PA-C [facility] 03/06/2016 5:15 PM

## 2016-03-06 NOTE — Patient Instructions (Signed)
     IF you received an x-ray today, you will receive an invoice from [facility]. Please contact [facility] at [phone] with questions or concerns regarding your invoice.   IF you received labwork today, you will receive an invoice from [facility]. Please contact [facility] at [phone] with questions or concerns regarding your invoice.   Our billing staff will not be able to assist you with questions regarding bills from these companies.  You will be contacted with the lab results as soon as they are available. The fastest way to get your results is to activate your My Chart account. Instructions are located on the last page of this paperwork. If you have not heard from us regarding the results in 2 weeks, please contact this office.      

## 2016-03-07 LAB — TRICHOMONAS VAGINALIS RNA, QL,MALES: Trichomonas vaginalis RNA: NOT DETECTED

## 2016-03-07 LAB — HIV ANTIBODY (ROUTINE TESTING W REFLEX): HIV 1&2 Ab, 4th Generation: NONREACTIVE

## 2016-03-07 LAB — GC/CHLAMYDIA PROBE AMP
CT Probe RNA: NOT DETECTED
GC Probe RNA: DETECTED — AB

## 2016-03-08 LAB — RPR

## 2016-09-20 ENCOUNTER — Ambulatory Visit (INDEPENDENT_AMBULATORY_CARE_PROVIDER_SITE_OTHER): Payer: Managed Care, Other (non HMO) | Admitting: Physician Assistant

## 2016-09-20 VITALS — BP 123/71 | HR 62 | Resp 16 | Wt 156.0 lb

## 2016-09-20 DIAGNOSIS — B078 Other viral warts: Secondary | ICD-10-CM

## 2016-09-20 DIAGNOSIS — Z23 Encounter for immunization: Secondary | ICD-10-CM | POA: Diagnosis not present

## 2016-09-20 NOTE — Patient Instructions (Addendum)
Pepcid AC.  10 mg. One tab at night. Take for 1 month and try not to miss dose.      IF you received an x-ray today, you will receive an invoice from [facility]. Please contact [facility] at [phone] with questions or concerns regarding your invoice.   IF you received labwork today, you will receive an invoice from [facility]. Please contact [facility] at [phone] with questions or concerns regarding your invoice.   Our billing staff will not be able to assist you with questions regarding bills from these companies.  You will be contacted with the lab results as soon as they are available. The fastest way to get your results is to activate your My Chart account. Instructions are located on the last page of this paperwork. If you have not heard from us regarding the results in 2 weeks, please contact this office.

## 2016-09-20 NOTE — Progress Notes (Signed)
  09/20/2016 10:15 AM   DOB: 1996-02-24 / MRN: [id number]  SUBJECTIVE:  Adrian Schmitt is a 21 y.o. male presenting for warts on his hands.  This have been present for about a year.  He has tried several otc therapies and this have failed.   He quit smoking about about 3 months ago and had been smoking for about 4 years.    He is sexually active with females.   He is allergic to ativan [lorazepam] and nembutal [pentobarbital].   He  has a past medical history of Polysubstance abuse; Seizure (HCC); and Seizures (HCC).    He  reports that he has been smoking.  He does not have any smokeless tobacco history on file. He reports that he drinks alcohol. He reports that he uses drugs, including Cocaine and IV. He  has no sexual activity history on file. The patient  has no past surgical history on file.  His family history is not on file.  Review of Systems  Constitutional: Negative for chills and fever.  Skin: Negative for itching and rash.    The problem list and medications were reviewed and updated by myself where necessary and exist elsewhere in the encounter.   OBJECTIVE:  BP 123/71 (Cuff Size: Normal)   Pulse 62   Resp 16   Wt 156 lb (70.8 kg)   SpO2 100%   BMI 21.16 kg/m   Physical Exam  Constitutional: He appears well-developed and well-nourished. No distress.  Cardiovascular: Normal rate and regular rhythm.   Pulmonary/Chest: Effort normal and breath sounds normal.  Musculoskeletal: Normal range of motion.  Neurological: He is alert.  Skin: He is not diaphoretic.     Procedure: Cryotherapy applied copiously x 7. Patient tolerated without complaint.   No results found for this or any previous visit (from the past 72 hour(s)).  No results found.  ASSESSMENT AND PLAN:  Adrian Schmitt was seen today for warts on 2 fingers.  Diagnoses and all orders for this visit:  Other viral warts  Need for Tdap vaccination  Other orders -     Tdap vaccine greater than or equal to  7yo IM    The patient is advised to call or return to clinic if he does not see an improvement in symptoms, or to seek the care of the closest emergency department if he worsens with the above plan.   Deliah BostonMichael Devone Bonilla, MHS, PA-C [facility] 09/20/2016 10:15 AM

## 2016-10-31 ENCOUNTER — Ambulatory Visit: Payer: Managed Care, Other (non HMO) | Admitting: Physician Assistant

## 2016-11-21 ENCOUNTER — Encounter: Payer: Self-pay | Admitting: Physician Assistant

## 2016-11-21 ENCOUNTER — Ambulatory Visit (INDEPENDENT_AMBULATORY_CARE_PROVIDER_SITE_OTHER): Payer: Managed Care, Other (non HMO) | Admitting: Physician Assistant

## 2016-11-21 VITALS — BP 110/70 | HR 61 | Temp 98.0°F | Resp 18 | Ht 73.23 in | Wt 161.0 lb

## 2016-11-21 DIAGNOSIS — Z114 Encounter for screening for human immunodeficiency virus [HIV]: Secondary | ICD-10-CM

## 2016-11-21 DIAGNOSIS — Z1159 Encounter for screening for other viral diseases: Secondary | ICD-10-CM | POA: Diagnosis not present

## 2016-11-21 DIAGNOSIS — B079 Viral wart, unspecified: Secondary | ICD-10-CM

## 2016-11-21 MED ORDER — FAMOTIDINE 20 MG PO TABS: ORAL_TABLET | ORAL | 0 refills | Status: DC

## 2016-11-21 NOTE — Patient Instructions (Addendum)
Please take all of the famotidine as prescribed.  Come back and see me in about 1 months if you are still having this problem so we can continue to work together to make it better.     IF you received an x-ray today, you will receive an invoice from [facility]. Please contact [facility] at [phone] with questions or concerns regarding your invoice.   IF you received labwork today, you will receive an invoice from [facility]. Please contact [facility] at [phone] with questions or concerns regarding your invoice.   Our billing staff will not be able to assist you with questions regarding bills from these companies.  You will be contacted with the lab results as soon as they are available. The fastest way to get your results is to activate your My Chart account. Instructions are located on the last page of this paperwork. If you have not heard from us regarding the results in 2 weeks, please contact this office.

## 2016-11-21 NOTE — Progress Notes (Signed)
    11/21/2016 9:29 AM   DOB: May 23, 1995 / MRN: [id number]  SUBJECTIVE:  Adrian Schmitt is a 21 y.o. male presenting for wart removal. He has new warts today.  Last HIV was negative about 1 year ago. Has a history of IV drug use but has been clean now for 15 months.   He is allergic to ativan [lorazepam] and nembutal [pentobarbital].   He  has a past medical history of Polysubstance abuse; Seizure (HCC); and Seizures (HCC).    He  reports that he has been smoking.  He has never used smokeless tobacco. He reports that he drinks alcohol. He reports that he uses drugs, including Cocaine and IV. He  has no sexual activity history on file. The patient  has no past surgical history on file.  His family history is not on file.  Review of Systems  Skin: Positive for rash. Negative for itching.    The problem list and medications were reviewed and updated by myself where necessary and exist elsewhere in the encounter.   OBJECTIVE:  BP 110/70   Pulse 61   Temp 98 F (36.7 C) (Oral)   Resp 18   Ht 6' 1.23" (1.86 m)   Wt 161 lb (73 kg)   SpO2 100%   BMI 21.11 kg/m   Physical Exam  Constitutional: He appears well-developed. He is active and cooperative.  Non-toxic appearance.  Cardiovascular: Normal rate.   Pulmonary/Chest: Effort normal. No tachypnea.  Neurological: He is alert.  Skin: Skin is warm and dry. Rash noted. He is not diaphoretic. No pallor.  Vitals reviewed.          No results found for this or any previous visit (from the past 72 hour(s)).  No results found.  ASSESSMENT AND PLAN:  Adrian Schmitt was seen today for verrucous vulgaris.  Diagnoses and all orders for this visit:  Viral warts, unspecified type -     famotidine (PEPCID) 20 MG tablet; Take one in the morning and one at night until finished.  Screening for HIV (human immunodeficiency virus)  Need for hepatitis C screening test -     HIV antibody  Need for hepatitis B screening test -     Acute Hep  Panel & Hep B Surface Ab    The patient is advised to call or return to clinic if he does not see an improvement in symptoms, or to seek the care of the closest emergency department if he worsens with the above plan.   Deliah BostonMichael Clark, MHS, PA-C [facility] 11/21/2016 9:29 AM

## 2016-11-22 ENCOUNTER — Telehealth: Payer: Self-pay

## 2016-11-22 LAB — ACUTE HEP PANEL AND HEP B SURFACE AB
Hep A IgM: NEGATIVE
Hep B C IgM: NEGATIVE
Hep C Virus Ab: <0.1 s/co ratio (ref 0.0–0.9)
Hepatitis B Surf Ab Quant: <3.1 m[IU]/mL — ABNORMAL LOW (ref >9.9)
Hepatitis B Surface Ag: NEGATIVE

## 2016-11-22 LAB — HIV ANTIBODY (ROUTINE TESTING W REFLEX): HIV Screen 4th Generation wRfx: NONREACTIVE

## 2016-11-22 NOTE — Telephone Encounter (Signed)
Call placed as requested to make patient aware of results. Patient verbalized understanding./ S.Haden Cavenaugh,CMA

## 2016-11-22 NOTE — Telephone Encounter (Signed)
-----   Message from Ofilia NeasMichael L Clark, PA-C sent at 11/22/2016  2:50 PM EDT ----- Please relay the good news. Deliah BostonMichael Clark, MS, PA-C 2:50 PM, 11/22/2016

## 2017-01-16 ENCOUNTER — Other Ambulatory Visit: Payer: Self-pay | Admitting: Physician Assistant

## 2017-01-16 DIAGNOSIS — B079 Viral wart, unspecified: Secondary | ICD-10-CM

## 2017-06-20 ENCOUNTER — Encounter: Payer: Self-pay | Admitting: Family Medicine

## 2017-06-20 ENCOUNTER — Ambulatory Visit (INDEPENDENT_AMBULATORY_CARE_PROVIDER_SITE_OTHER): Payer: Managed Care, Other (non HMO) | Admitting: Family Medicine

## 2017-06-20 ENCOUNTER — Ambulatory Visit: Payer: Managed Care, Other (non HMO) | Admitting: Physician Assistant

## 2017-06-20 VITALS — BP 113/66 | HR 78 | Temp 97.9°F | Resp 16 | Ht 73.0 in | Wt 161.2 lb

## 2017-06-20 DIAGNOSIS — B079 Viral wart, unspecified: Secondary | ICD-10-CM | POA: Insufficient documentation

## 2017-06-20 DIAGNOSIS — B078 Other viral warts: Secondary | ICD-10-CM | POA: Diagnosis not present

## 2017-06-20 MED ORDER — IMIQUIMOD 5 % EX CREA: TOPICAL_CREAM | CUTANEOUS | 3 refills | Status: DC

## 2017-06-20 NOTE — Patient Instructions (Addendum)
Apply the Aldara cream 5 times per week. Okay to apply over-the-counter salicylic acid as well for thickened areas. I would like you to meet with dermatology again to see if other treatments are recommended besides Aldara if this is not improving in the next month. It may take a few months to see results with this approach, but may work better than just freezing. You can also take over-the-counter Pepcid twice per day as well during this treatment.  Return to the clinic or go to the nearest emergency room if any of your symptoms worsen or new symptoms occur.     Warts Warts are small growths on the skin. They are common, and they are caused by a type of germ (virus). Warts can occur on many areas of the body. A person may have one wart or more than one wart. Warts can spread if you scratch a wart and then scratch normal skin. Most warts will go away over many months to a couple years. Treatments may be done if needed. Follow these instructions at home:  Apply over-the-counter and prescription medicines only as told by your doctor.  Do not apply over-the-counter wart medicines to your face or genitals before you ask your doctor if it is okay to do that.  Do not scratch or pick at a wart.  Wash your hands after you touch a wart.  Avoid shaving hair that is over a wart.  Keep all follow-up visits as told by your doctor. This is important. Contact a doctor if:  Your warts do not improve after treatment.  You have redness, swelling, or pain at the site of a wart.  You have bleeding from a wart, and the bleeding does not stop when you put light pressure on the wart.  You have diabetes and you get a wart. This information is not intended to replace advice given to you by your health care provider. Make sure you discuss any questions you have with your health care provider. Document Released: 08/23/2010 Document Revised: 09/28/2015 Document Reviewed: 07/18/2014 Elsevier Interactive Patient  Education  2018 [employer]     IF you received an x-ray today, you will receive an invoice from [facility]. Please contact [facility] at [phone] with questions or concerns regarding your invoice.   IF you received labwork today, you will receive an invoice from [facility]. Please contact [facility] at [phone] with questions or concerns regarding your invoice.   Our billing staff will not be able to assist you with questions regarding bills from these companies.  You will be contacted with the lab results as soon as they are available. The fastest way to get your results is to activate your My Chart account. Instructions are located on the last page of this paperwork. If you have not heard from us regarding the results in 2 weeks, please contact this office.

## 2017-06-20 NOTE — Progress Notes (Signed)
Subjective:  By signing my name below, I, Adrian Schmitt, attest that this documentation has been prepared under the direction and in the presence of Adrian Ray, MD. Electronically Signed: Moises Schmitt, [place]. 06/20/2017 , 10:24 AM .  Patient was seen in Room 8 .   Patient ID: Adrian Schmitt, male    DOB: 18-Dec-1995, 22 y.o.   MRN: [id number] Chief Complaint  Patient presents with  . wart removal    on left and right hand; gets them often   HPI Adrian Schmitt is a 22 y.o. male Here for wart removal over bilateral hands. He was last seen on July 19th, 2018. At that time, he was treated with pepcid. He had warts on thumbs, finger, and forearm. He had cryotherapy done in May 2018.   Patient states the wart over his left middle finger improved a little with Pepcid. He's been trying to remove it himself with OTC freezing spray, tried x10 times now. He notes this wart over his left middle finger bothers him the most. He's seen dermatology in the past, and they froze the warts as well without relief. He mentions enjoying rock climbing, believes warts from this.   He mentions the warts over the right thumb, and left middle finger have been present for 8 months now. He noticed new wart recently appearing over his left 2nd finger, about a week ago.   There are no active problems to display for this patient.  Past Medical History:  Diagnosis Date  . Polysubstance abuse ([place])   . Seizure ([place])   . Seizures ([place])    No past surgical history on file. Allergies  Allergen Reactions  . Ativan [Lorazepam]     Does not work, has opposite reaction   . Nembutal [Pentobarbital]     Has opposite reaction   Prior to Admission medications   Medication Sig Start Date End Date Taking? Authorizing Provider  famotidine (PEPCID) 20 MG tablet TAKE ONE TABLET BY MOUTH EVERY MORNING AND TAKE ONE TABLET BY MOUTH EVERY NIGHT AT BEDTIME UNTIL FINISHED 01/16/17   [name], MD   Social History    Socioeconomic History  . Marital status: Single    Spouse name: Not on file  . Number of children: Not on file  . Years of education: Not on file  . Highest education level: Not on file  Social Needs  . Financial resource strain: Not on file  . Food insecurity - worry: Not on file  . Food insecurity - inability: Not on file  . Transportation needs - medical: Not on file  . Transportation needs - non-medical: Not on file  Occupational History  . Not on file  Tobacco Use  . Smoking status: Current Every Day Smoker  . Smokeless tobacco: Never Used  Substance and Sexual Activity  . Alcohol use: Yes  . Drug use: Yes    Types: Cocaine, IV    Comment: heroin  . Sexual activity: Not on file  Other Topics Concern  . Not on file  Social History Narrative  . Not on file   Review of Systems  Constitutional: Negative for fatigue, fever and unexpected weight change.  Eyes: Negative for visual disturbance.  Respiratory: Negative for cough, chest tightness and shortness of breath.   Cardiovascular: Negative for chest pain, palpitations and leg swelling.  Gastrointestinal: Negative for abdominal pain, Schmitt in stool, nausea and vomiting.  Skin: Positive for rash.       Bumps over left  and right hands  Neurological: Negative for dizziness, light-headedness and headaches.       Objective:   Physical Exam  Constitutional: He is oriented to person, place, and time. He appears well-developed and well-nourished. No distress.  HENT:  Head: Normocephalic and atraumatic.  Eyes: EOM are normal. Pupils are equal, round, and reactive to light.  Neck: Neck supple.  Cardiovascular: Normal rate.  Pulmonary/Chest: Effort normal. No respiratory distress.  Musculoskeletal: Normal range of motion.  Neurological: He is alert and oriented to person, place, and time.  Skin: Skin is warm and dry.  Left hand: 3rd finger approximately 1.5cm x 1.5cm verrucous lesion at 3rd proximal phalanx; small  verrucous lesion left 2nd middle phalanx, similar appearing lesion at the lateral nail fold of 2nd finger; thumb with a 71m lesion Right hand: IP of thumb verrucous lesion 1cm across  Psychiatric: He has a normal mood and affect. His behavior is normal.  Nursing note and vitals reviewed.   Vitals:   06/20/17 0947  BP: 113/66  Pulse: 78  Resp: 16  Temp: 97.9 F (36.6 C)  TempSrc: Oral  SpO2: 94%  Weight: 161 lb 3.2 oz (73.1 kg)  Height: '6\' 1"'  (1.854 m)      Assessment & Plan:    Adrian RIVENBURGis a 22y.o. male Other viral warts - Plan: imiquimod (ALDARA) 5 % cream Recurrent hand warts, multiple episodes of freezing in the past as well as treatments. Has met with dermatology has to also has a therapy. Attempted antihistamine last time with minimal improvement.  - will try different approach with imiquimod 5 times per week, salicylic acid to thickened areas on hands if needed, then if not improving within the next month would recommend repeat dermatology evaluation. Potential side effects discussed, and for 2-3 months of treatment may be needed.  RTC precautions if worse.   Meds ordered this encounter  Medications  . imiquimod (ALDARA) 5 % cream    Sig: Apply to affected areas 5 times per week.    Dispense:  24 each    Refill:  3   Patient Instructions   Apply the Aldara cream 5 times per week. Okay to apply over-the-counter salicylic acid as well for thickened areas. I would like you to meet with dermatology again to see if other treatments are recommended besides Aldara if this is not improving in the next month. It may take a few months to see results with this approach, but may work better than just freezing. You can also take over-the-counter Pepcid twice per day as well during this treatment.  Return to the clinic or go to the nearest emergency room if any of your symptoms worsen or new symptoms occur.     Warts Warts are small growths on the skin. They are common, and  they are caused by a type of germ (virus). Warts can occur on many areas of the body. A person may have one wart or more than one wart. Warts can spread if you scratch a wart and then scratch normal skin. Most warts will go away over many months to a couple years. Treatments may be done if needed. Follow these instructions at home:  Apply over-the-counter and prescription medicines only as told by your doctor.  Do not apply over-the-counter wart medicines to your face or genitals before you ask your doctor if it is okay to do that.  Do not scratch or pick at a wart.  Wash your hands after you  touch a wart.  Avoid shaving hair that is over a wart.  Keep all follow-up visits as told by your doctor. This is important. Contact a doctor if:  Your warts do not improve after treatment.  You have redness, swelling, or pain at the site of a wart.  You have bleeding from a wart, and the bleeding does not stop when you put light pressure on the wart.  You have diabetes and you get a wart. This information is not intended to replace advice given to you by your health care provider. Make sure you discuss any questions you have with your health care provider. Document Released: 08/23/2010 Document Revised: 09/28/2015 Document Reviewed: 07/18/2014 Elsevier Interactive Patient Education  2018 [employer].     IF you received an x-Schmitt today, you will receive an invoice from [facility]. Please contact [facility] at [phone] with questions or concerns regarding your invoice.   IF you received labwork today, you will receive an invoice from [facility]. Please contact [facility] at [phone] with questions or concerns regarding your invoice.   Our billing staff will not be able to assist you with questions regarding bills from these companies.  You will be contacted with the lab results as soon as they are available. The fastest way to get your results is to activate your My  Chart account. Instructions are located on the last page of this paperwork. If you have not heard from [place] regarding the results in 2 weeks, please contact this office.       I personally performed the services described in this documentation, which was scribed in my presence. The recorded information has been reviewed and considered for accuracy and completeness, addended by me as needed, and agree with information above.  Signed,   Adrian Ray, MD [facility] [place].  06/20/17 10:33 AM

## 2018-03-14 ENCOUNTER — Emergency Department (HOSPITAL_COMMUNITY): Payer: Managed Care, Other (non HMO)

## 2018-03-14 ENCOUNTER — Other Ambulatory Visit: Payer: Self-pay

## 2018-03-14 ENCOUNTER — Emergency Department (HOSPITAL_COMMUNITY)
Admission: EM | Admit: 2018-03-14 | Discharge: 2018-03-14 | Disposition: A | Discharge status: Home / Self Care | Payer: Managed Care, Other (non HMO) | Attending: Emergency Medicine | Admitting: Emergency Medicine

## 2018-03-14 ENCOUNTER — Encounter (HOSPITAL_COMMUNITY): Payer: Self-pay | Admitting: *Deleted

## 2018-03-14 DIAGNOSIS — S21212A Laceration without foreign body of left back wall of thorax without penetration into thoracic cavity, initial encounter: Secondary | ICD-10-CM

## 2018-03-14 DIAGNOSIS — Y9383 Activity, rough housing and horseplay: Secondary | ICD-10-CM | POA: Diagnosis not present

## 2018-03-14 DIAGNOSIS — Y929 Unspecified place or not applicable: Secondary | ICD-10-CM | POA: Diagnosis not present

## 2018-03-14 DIAGNOSIS — Z23 Encounter for immunization: Secondary | ICD-10-CM | POA: Diagnosis not present

## 2018-03-14 DIAGNOSIS — W19XXXA Unspecified fall, initial encounter: Secondary | ICD-10-CM

## 2018-03-14 DIAGNOSIS — Z87891 Personal history of nicotine dependence: Secondary | ICD-10-CM | POA: Diagnosis not present

## 2018-03-14 DIAGNOSIS — W25XXXA Contact with sharp glass, initial encounter: Secondary | ICD-10-CM | POA: Diagnosis not present

## 2018-03-14 DIAGNOSIS — Y999 Unspecified external cause status: Secondary | ICD-10-CM | POA: Insufficient documentation

## 2018-03-14 DIAGNOSIS — S3992XA Unspecified injury of lower back, initial encounter: Secondary | ICD-10-CM | POA: Diagnosis present

## 2018-03-14 MED ORDER — LIDOCAINE-EPINEPHRINE (PF) 2 %-1:200000 IJ SOLN
20.0000 mL | Freq: Once | INTRAMUSCULAR | Status: AC
  Administered 2018-03-14: 20 mL
  Filled 2018-03-14: qty 20

## 2018-03-14 MED ORDER — TETANUS-DIPHTH-ACELL PERTUSSIS 5-2.5-18.5 LF-MCG/0.5 IM SUSP
0.5000 mL | Freq: Once | INTRAMUSCULAR | Status: AC
  Administered 2018-03-14: 0.5 mL via INTRAMUSCULAR
  Filled 2018-03-14: qty 0.5

## 2018-03-14 NOTE — ED Provider Notes (Signed)
[facility]-EMERGENCY DEPT Provider Note   CSN: [id number] Arrival date & time: 03/14/18  1950     History   Chief Complaint Chief Complaint  Patient presents with  . Laceration    left lateral back    HPI Adrian Schmitt is a 22 y.o. male center for evaluation of back laceration.  Patient states he was roughhousing when he fell backwards, landing on a glass table in his upper back, slid down and cut his left lower back.  This happened just prior to arrival.  He reports mild pain at the site.  He has not taken anything for pain including Tylenol or ibuprofen.  He is not on any blood thinners.  He has no medical problems, takes no medications daily.  He does not know when his last tetanus shot was.  He is not having any difficulty walking.  Additional history obtained from chart review, patient with a history of substance abuse, although has not been seen for drug abuse for several years.   HPI  Past Medical History:  Diagnosis Date  . Polysubstance abuse ([facility])   . Seizure ([facility])   . Seizures [facility])     Patient Active Problem List   Diagnosis Date Noted  . Wart 06/20/2017    History reviewed. No pertinent surgical history.      Home Medications    Prior to Admission medications   Medication Sig Start Date End Date Taking? Authorizing Provider  ibuprofen (ADVIL,MOTRIN) 200 MG tablet Take 200 mg by mouth every 6 (six) hours as needed.   Yes [provider]    Family History No family history on file.  Social History Social History   Tobacco Use  . Smoking status: Former Games developer  . Smokeless tobacco: Never Used  Substance Use Topics  . Alcohol use: Not Currently  . Drug use: Not Currently    Comment: heroin     Allergies   Ativan [lorazepam] and Nembutal [pentobarbital]   Review of Systems Review of Systems  Skin: Positive for wound.  Hematological: Does not bruise/bleed easily.     Physical Exam Updated Vital Signs BP  134/73   Pulse 72   Temp 98 F (36.7 C) (Oral)   Resp 16   Ht 6\' 1"  (1.854 m)   Wt 73 kg   SpO2 99%   BMI 21.24 kg/m   Physical Exam  Constitutional: He is oriented to person, place, and time. He appears well-developed and well-nourished. No distress.  HENT:  Head: Normocephalic and atraumatic.  Eyes: Pupils are equal, round, and reactive to light. Conjunctivae and EOM are normal.  Neck: Normal range of motion. Neck supple.  Cardiovascular: Normal rate, regular rhythm and intact distal pulses.  Pulmonary/Chest: Effort normal and breath sounds normal. No respiratory distress. He has no wheezes.  Abdominal: Soft. Bowel sounds are normal. He exhibits no distension. There is no tenderness.  Musculoskeletal: Normal range of motion.  Ambulatory without difficulty.  Strength of lower extremity is intact bilaterally.  Neurological: He is alert and oriented to person, place, and time. No sensory deficit.  Sensation of lower extremities intact bilaterally.  Skin: Skin is warm and dry. Capillary refill takes less than 2 seconds.  3 cm laceration of the left low back  Psychiatric: He has a normal mood and affect.  Nursing note and vitals reviewed.    ED Treatments / Results  Labs (all labs ordered are listed, but only abnormal results are displayed) Labs Reviewed - No  data to display  EKG None  Radiology Dg Pelvis 1-2 Views  Result Date: 03/14/2018 CLINICAL DATA:  Left lower back/buttock laceration. Fell on glass table. EXAM: PELVIS - 1-2 VIEW COMPARISON:  None. FINDINGS: Both hips are normally located. No hip fracture or AVN. The pubic symphysis and SI joints are intact. No pelvic fractures or bone lesions. No obvious radiopaque foreign bodies. IMPRESSION: No acute bony findings or radiopaque foreign body. Electronically Signed   By: Rudie Meyer M.D.   On: 03/14/2018 20:59    Procedures .Marland KitchenLaceration Repair Date/Time: 03/14/2018 10:56 PM Performed by: Alveria Apley,  PA-C Authorized by: Alveria Apley, PA-C   Consent:    Consent obtained:  Verbal   Consent given by:  Patient   Risks discussed:  Infection, pain, poor cosmetic result, poor wound healing, tendon damage, vascular damage and need for additional repair Anesthesia (see MAR for exact dosages):    Anesthesia method:  Local infiltration   Local anesthetic:  Lidocaine 2% WITH epi Laceration details:    Location:  Trunk   Trunk location:  Lower back   Length (cm):  3   Depth (mm):  10 Repair type:    Repair type:  Intermediate Pre-procedure details:    Preparation:  Patient was prepped and draped in usual sterile fashion and imaging obtained to evaluate for foreign bodies Exploration:    Wound exploration: wound explored through full range of motion and entire depth of wound probed and visualized     Wound extent: no foreign bodies/material noted, no nerve damage noted, no underlying fracture noted and no vascular damage noted   Treatment:    Area cleansed with:  Saline   Amount of cleaning:  Extensive   Irrigation solution:  Sterile saline   Irrigation method:  Syringe Subcutaneous repair:    Suture size:  4-0   Suture material:  Fast-absorbing gut   Suture technique:  Vertical mattress   Number of sutures:  3 Skin repair:    Repair method:  Sutures   Suture size:  4-0   Suture material:  Prolene   Suture technique:  Horizontal mattress   Number of sutures:  4 Approximation:    Approximation:  Close Post-procedure details:    Dressing:  Sterile dressing   Patient tolerance of procedure:  Tolerated well, no immediate complications   (including critical care time)  Medications Ordered in ED Medications  Tdap (BOOSTRIX) injection 0.5 mL (0.5 mLs Intramuscular Given 03/14/18 2057)  lidocaine-EPINEPHrine (XYLOCAINE W/EPI) 2 %-1:200000 (PF) injection 20 mL (20 mLs Infiltration Given 03/14/18 2057)     Initial Impression / Assessment and Plan / ED Course  I have reviewed the  triage vital signs and the nursing notes.  Pertinent labs & imaging results that were available during my care of the patient were reviewed by me and considered in my medical decision making (see chart for details).     Patient presenting for evaluation of back laceration.  Physical exam reassuring, he is neurovascularly intact.  Will obtain x-ray to assess for glass.  X-ray viewed interpreted by me, no obvious foreign body.  Wound cleaned and repaired as described above.  Tetanus updated.  Discussed wound care instructions.  At this time, patient appears safe for discharge.  Return precautions given.  Patient states he understands and agrees to plan.  Final Clinical Impressions(s) / ED Diagnoses   Final diagnoses:  Laceration of left side of back, initial encounter  Fall, initial encounter    ED Discharge Orders  None       Alveria Apley, PA-C 03/14/18 2258    Shaune Pollack, MD 03/16/18 1420

## 2018-03-14 NOTE — ED Notes (Signed)
2" laceration to L lower back incurred by broken glass while "rough-housing" with roommate.  Pt AOx4, respirations even and unlabored.  Pain rated 1-2/10.  Abd pad in place.  Tetanus vaccine administered as ordered, tolerated well.

## 2018-03-14 NOTE — Discharge Instructions (Addendum)
1. Medications: Tylenol or ibuprofen for pain 2. Treatment: ice for swelling, keep wound clean with warm soap and water and keep bandage dry. Do not lift or do excessive bending, as this will cause the stitches to break.  3. Follow Up: Please return in 10 days to have your stitches removed or sooner if you have concerns. Return to the emergency department for increased redness, drainage of pus from the wound   WOUND CARE  Remove bandage and wash wound gently with mild soap and warm water daily. Reapply a new bandage after cleaning wound  Continue daily cleansing with soap and water until stitches are removed.  Do not apply any ointments or creams to the wound while stitches are in place, as this may cause delayed healing. Return if you experience any of the following signs of infection: Swelling, redness, pus drainage, streaking, fever >101.0 F  Return if you experience excessive bleeding that does not stop after 15-20 minutes of constant, firm pressure.

## 2018-03-14 NOTE — ED Triage Notes (Signed)
Pt stated "was horsing around and fell on a glass table."  Pt presents with lac to left lateral back.

## 2019-02-27 ENCOUNTER — Encounter (HOSPITAL_COMMUNITY): Payer: Self-pay

## 2019-02-27 ENCOUNTER — Ambulatory Visit (HOSPITAL_COMMUNITY)
Admission: EM | Admit: 2019-02-27 | Discharge: 2019-02-27 | Disposition: A | Discharge status: Home / Self Care | Payer: Managed Care, Other (non HMO) | Attending: Family Medicine | Admitting: Family Medicine

## 2019-02-27 ENCOUNTER — Other Ambulatory Visit: Payer: Self-pay

## 2019-02-27 DIAGNOSIS — Z711 Person with feared health complaint in whom no diagnosis is made: Secondary | ICD-10-CM | POA: Insufficient documentation

## 2019-02-27 DIAGNOSIS — R300 Dysuria: Secondary | ICD-10-CM | POA: Diagnosis present

## 2019-02-27 LAB — POCT URINALYSIS DIP (DEVICE)
Bilirubin Urine: NEGATIVE
Glucose, UA: NEGATIVE mg/dL
Hgb urine dipstick: NEGATIVE
Ketones, ur: NEGATIVE mg/dL
Nitrite: NEGATIVE
Protein, ur: NEGATIVE mg/dL
Specific Gravity, Urine: >=1.03 (ref 1.005–1.030)
Urobilinogen, UA: 0.2 mg/dL (ref 0.0–1.0)
pH: 5.5 (ref 5.0–8.0)

## 2019-02-27 MED ORDER — CEFTRIAXONE SODIUM 250 MG IJ SOLR
250.0000 mg | Freq: Once | INTRAMUSCULAR | Status: AC
  Administered 2019-02-27: 18:00:00 250 mg via INTRAMUSCULAR

## 2019-02-27 MED ORDER — CEFTRIAXONE SODIUM 250 MG IJ SOLR
INTRAMUSCULAR | Status: AC
  Filled 2019-02-27: qty 250

## 2019-02-27 MED ORDER — AZITHROMYCIN 250 MG PO TABS
1000.0000 mg | ORAL_TABLET | Freq: Once | ORAL | Status: AC
  Administered 2019-02-27: 1000 mg via ORAL

## 2019-02-27 MED ORDER — AZITHROMYCIN 250 MG PO TABS
ORAL_TABLET | ORAL | Status: AC
  Filled 2019-02-27: qty 1

## 2019-02-27 MED ORDER — AZITHROMYCIN 250 MG PO TABS
ORAL_TABLET | ORAL | Status: AC
  Filled 2019-02-27: qty 4

## 2019-02-27 NOTE — Discharge Instructions (Signed)

## 2019-02-27 NOTE — ED Triage Notes (Signed)
Pt present exposure to an STD. Pt states when he urinates he is having a burning sensation.

## 2019-03-01 NOTE — ED Provider Notes (Signed)
[facility]   [id number] 02/27/19 Arrival Time: 1636  ASSESSMENT & PLAN:  1. Concern about STD in male without diagnosis   2. Dysuria       Discharge Instructions     You have been given the following medications today for treatment of suspected gonorrhea and/or chlamydia:  cefTRIAXone (ROCEPHIN) injection 250 mg azithromycin (ZITHROMAX) tablet 1,000 mg  Even though we have treated you today, we have sent testing for sexually transmitted infections. We will notify you of any positive results once they are received. If required, we will prescribe any medications you might need.  Please refrain from all sexual activity for at least the next seven days.     Pending: Labs Reviewed  POCT URINALYSIS DIP (DEVICE) - Abnormal; Notable for the following components:      Result Value   Leukocytes,Ua TRACE (*)    All other components within normal limits  CYTOLOGY, (ORAL, ANAL, URETHRAL) ANCILLARY ONLY    Will notify of any positive results. Instructed to refrain from sexual activity for at least seven days.  Reviewed expectations re: course of current medical issues. Questions answered. Outlined signs and symptoms indicating need for more acute intervention. Patient verbalized understanding. After Visit Summary given.   SUBJECTIVE:  Adrian Schmitt is a 23 y.o. male who presents with complaint of mild discomfort with urination. Onset abrupt. First noticed few d ago. Describes discomfort as a "burning". No specific penile discharge noted. Is concerned re: STI.  Denies: urinary frequency, hematuria, urinary hesitancy, chills, sweats and urinary incontinence. Afebrile. No abdominal or pelvic pain. No n/v. No rashes or lesions. Reports that he is sexually active with single male partner. OTC treatment: none. History of STI: none reported.  ROS: As per HPI. All other systems negative.   OBJECTIVE:  Vitals:   02/27/19 1647  BP: 130/80  Pulse: 95  Resp: 16  Temp:  98.5 F (36.9 C)  TempSrc: Oral  SpO2: 100%     General appearance: alert, cooperative, appears stated age and no distress Throat: lips, mucosa, and tongue normal; teeth and gums normal Neck: supple with FROM CV: RRR Lungs: CTAB Back: no CVA tenderness; FROM at waist Abdomen: soft, non-tender; without flank TTP GU: normal appearing genitalia Skin: warm and dry Psychological: alert and cooperative; normal mood and affect.  Results for orders placed or performed during the hospital encounter of 02/27/19  POCT urinalysis dip (device)  Result Value Ref Range   Glucose, UA NEGATIVE NEGATIVE mg/dL   Bilirubin Urine NEGATIVE NEGATIVE   Ketones, ur NEGATIVE NEGATIVE mg/dL   Specific Gravity, Urine >=1.030 1.005 - 1.030   Hgb urine dipstick NEGATIVE NEGATIVE   pH 5.5 5.0 - 8.0   Protein, ur NEGATIVE NEGATIVE mg/dL   Urobilinogen, UA 0.2 0.0 - 1.0 mg/dL   Nitrite NEGATIVE NEGATIVE   Leukocytes,Ua TRACE (A) NEGATIVE    Labs Reviewed  POCT URINALYSIS DIP (DEVICE) - Abnormal; Notable for the following components:      Result Value   Leukocytes,Ua TRACE (*)    All other components within normal limits  CYTOLOGY, (ORAL, ANAL, URETHRAL) ANCILLARY ONLY    Allergies  Allergen Reactions  . Ativan [Lorazepam]     Does not work, has opposite reaction   . Nembutal [Pentobarbital]     Has opposite reaction    Past Medical History:  Diagnosis Date  . Polysubstance abuse ([facility])   . Seizure ([facility])   . Seizures ([facility])    FH: HTN  Social History  Socioeconomic History  . Marital status: Single    Spouse name: Not on file  . Number of children: Not on file  . Years of education: Not on file  . Highest education level: Not on file  Occupational History  . Not on file  Social Needs  . Financial resource strain: Not on file  . Food insecurity    Worry: Not on file    Inability: Not on file  . Transportation needs    Medical: Not on file    Non-medical: Not on file  Tobacco  Use  . Smoking status: Former Research scientist (life sciences)  . Smokeless tobacco: Never Used  Substance and Sexual Activity  . Alcohol use: Not Currently  . Drug use: Not Currently    Comment: heroin  . Sexual activity: Not on file  Lifestyle  . Physical activity    Days per week: Not on file    Minutes per session: Not on file  . Stress: Not on file  Relationships  . Social Herbalist on phone: Not on file    Gets together: Not on file    Attends religious service: Not on file    Active member of club or organization: Not on file    Attends meetings of clubs or organizations: Not on file    Relationship status: Not on file  . Intimate partner violence    Fear of current or ex partner: Not on file    Emotionally abused: Not on file    Physically abused: Not on file    Forced sexual activity: Not on file  Other Topics Concern  . Not on file  Social History Narrative  . Not on file          Vanessa Kick, MD 03/01/19 [phone]

## 2019-03-02 ENCOUNTER — Telehealth (HOSPITAL_COMMUNITY): Payer: Self-pay | Admitting: Emergency Medicine

## 2019-03-02 LAB — CYTOLOGY, (ORAL, ANAL, URETHRAL) ANCILLARY ONLY
Chlamydia: POSITIVE — AB
Neisseria Gonorrhea: NEGATIVE
Trichomonas: NEGATIVE

## 2019-03-02 NOTE — Telephone Encounter (Signed)
  Chlamydia is positive.  This was treated at the urgent care visit with po zithromax 1g.  Pt needs education to please refrain from sexual intercourse for 7 days to give the medicine time to work.  Sexual partners need to be notified and tested/treated.  Condoms may reduce risk of reinfection.  Recheck or followup with PCP for further evaluation if symptoms are not improving.  [facility] notified.  Attempted to reach patient. No answer at this time. Voicemail full.

## 2019-03-04 ENCOUNTER — Telehealth (HOSPITAL_COMMUNITY): Payer: Self-pay | Admitting: Emergency Medicine

## 2019-03-04 NOTE — Telephone Encounter (Signed)
Patient contacted and made aware of   cytology results, all questions answered  

## 2019-10-29 ENCOUNTER — Encounter (HOSPITAL_COMMUNITY): Payer: Self-pay

## 2019-10-29 ENCOUNTER — Ambulatory Visit (HOSPITAL_COMMUNITY)
Admission: EM | Admit: 2019-10-29 | Discharge: 2019-10-29 | Disposition: A | Discharge status: Home / Self Care | Payer: Managed Care, Other (non HMO) | Attending: Emergency Medicine | Admitting: Emergency Medicine

## 2019-10-29 ENCOUNTER — Other Ambulatory Visit: Payer: Self-pay

## 2019-10-29 DIAGNOSIS — Z202 Contact with and (suspected) exposure to infections with a predominantly sexual mode of transmission: Secondary | ICD-10-CM | POA: Diagnosis present

## 2019-10-29 DIAGNOSIS — N342 Other urethritis: Secondary | ICD-10-CM | POA: Diagnosis present

## 2019-10-29 MED ORDER — AZITHROMYCIN 250 MG PO TABS
ORAL_TABLET | ORAL | Status: AC
  Filled 2019-10-29: qty 4

## 2019-10-29 MED ORDER — AZITHROMYCIN 250 MG PO TABS
1000.0000 mg | ORAL_TABLET | Freq: Once | ORAL | Status: AC
  Administered 2019-10-29: 1000 mg via ORAL

## 2019-10-29 NOTE — Discharge Instructions (Signed)
We have treated you today for chlamydia, with azithromycin. Please refrain from sexual activity for 7 days while medicine is clearing infection. ° °We are testing you for Gonorrhea, Chlamydia and Trichomonas. We will call you if anything is positive and let you know if you require any further treatment. Please inform partner of any positive results. ° °Please return if symptoms not improving with treatment, development of fever, nausea, vomiting, abdominal pain, scrotal pain. °

## 2019-10-29 NOTE — ED Triage Notes (Signed)
Pt presents for STD's testing. Pt states hsi sexual partner tested positive for Chlamydia.

## 2019-10-29 NOTE — ED Provider Notes (Signed)
[place]    CSN: [id number] Arrival date & time: 10/29/19  1752      History   Chief Complaint Chief Complaint  Patient presents with  . Exposure to STD    HPI Adrian Schmitt is a 24 y.o. male presenting today for STD screening after exposure.  Patient reports that his partner tested positive for chlamydia recently.  Denies any other positive test results.  He has noticed some mild tingling with urination over the past 3 to 4 days.  Denies discharge or dysuria.  Denies testicle pain or swelling.  Denies rashes or lesions.  Denies abdominal pain nausea or vomiting.  HPI  Past Medical History:  Diagnosis Date  . Polysubstance abuse ([place])   . Seizure ([place])   . Seizures [facility])     Patient Active Problem List   Diagnosis Date Noted  . Wart 06/20/2017    History reviewed. No pertinent surgical history.     Home Medications    Prior to Admission medications   Medication Sig Start Date End Date Taking? Authorizing Provider  ibuprofen (ADVIL,MOTRIN) 200 MG tablet Take 200 mg by mouth every 6 (six) hours as needed.    [provider]    Family History History reviewed. No pertinent family history.  Social History Social History   Tobacco Use  . Smoking status: Former Research scientist (life sciences)  . Smokeless tobacco: Never Used  Vaping Use  . Vaping Use: Every day  Substance Use Topics  . Alcohol use: Not Currently  . Drug use: Not Currently    Comment: heroin     Allergies   Ativan [lorazepam] and Nembutal [pentobarbital]   Review of Systems Review of Systems  Constitutional: Negative for fever.  HENT: Negative for sore throat.   Respiratory: Negative for shortness of breath.   Cardiovascular: Negative for chest pain.  Gastrointestinal: Negative for abdominal pain, nausea and vomiting.  Genitourinary: Negative for difficulty urinating, discharge, dysuria, frequency, penile pain, penile swelling, scrotal swelling and testicular pain.  Skin: Negative for  rash.  Neurological: Negative for dizziness, light-headedness and headaches.     Physical Exam Triage Vital Signs ED Triage Vitals  Enc Vitals Group     BP 10/29/19 1824 (!) 145/83     Pulse Rate 10/29/19 1824 (!) 56     Resp 10/29/19 1824 16     Temp 10/29/19 1824 98.2 F (36.8 C)     Temp Source 10/29/19 1824 Oral     SpO2 10/29/19 1824 100 %     Weight --      Height --      Head Circumference --      Peak Flow --      Pain Score 10/29/19 1825 0     Pain Loc --      Pain Edu? --      Excl. in [place]? --    No data found.  Updated Vital Signs BP (!) 145/83 (BP Location: Right Arm)   Pulse (!) 56   Temp 98.2 F (36.8 C) (Oral)   Resp 16   SpO2 100%   Visual Acuity Right Eye Distance:   Left Eye Distance:   Bilateral Distance:    Right Eye Near:   Left Eye Near:    Bilateral Near:     Physical Exam Vitals and nursing note reviewed.  Constitutional:      Appearance: He is well-developed.     Comments: No acute distress  HENT:     Head:  Normocephalic and atraumatic.     Nose: Nose normal.  Eyes:     Conjunctiva/sclera: Conjunctivae normal.  Cardiovascular:     Rate and Rhythm: Normal rate.  Pulmonary:     Effort: Pulmonary effort is normal. No respiratory distress.  Abdominal:     General: There is no distension.  Musculoskeletal:        General: Normal range of motion.     Cervical back: Neck supple.  Skin:    General: Skin is warm and dry.  Neurological:     Mental Status: He is alert and oriented to person, place, and time.      UC Treatments / Results  Labs (all labs ordered are listed, but only abnormal results are displayed) Labs Reviewed  CYTOLOGY, (ORAL, ANAL, URETHRAL) ANCILLARY ONLY    EKG   Radiology No results found.  Procedures Procedures (including critical care time)  Medications Ordered in UC Medications  azithromycin (ZITHROMAX) tablet 1,000 mg (1,000 mg Oral Given 10/29/19 1854)    Initial Impression / Assessment  and Plan / UC Course  I have reviewed the triage vital signs and the nursing notes.  Pertinent labs & imaging results that were available during my care of the patient were reviewed by me and considered in my medical decision making (see chart for details).     Exposure to chlamydia, treating with azithromycin 1 g today prior to discharge.  Urethral swab pending, will screen for gonorrhea, chlamydia and trichomonas.  HIV and RPR declined.  Will provide further treatment as needed based off results.  Discussed strict return precautions. Patient verbalized understanding and is agreeable with plan.  Final Clinical Impressions(s) / UC Diagnoses   Final diagnoses:  STD exposure  Urethritis     Discharge Instructions     We have treated you today for chlamydia, with azithromycin. Please refrain from sexual activity for 7 days while medicine is clearing infection.  We are testing you for Gonorrhea, Chlamydia and Trichomonas. We will call you if anything is positive and let you know if you require any further treatment. Please inform partner of any positive results.  Please return if symptoms not improving with treatment, development of fever, nausea, vomiting, abdominal pain, scrotal pain.    ED Prescriptions    None     PDMP not reviewed this encounter.   Lew Dawes, [place] 10/29/19 1946

## 2019-11-01 LAB — CYTOLOGY, (ORAL, ANAL, URETHRAL) ANCILLARY ONLY
Chlamydia: NEGATIVE
Comment: NEGATIVE
Comment: NEGATIVE
Comment: NORMAL
Neisseria Gonorrhea: NEGATIVE
Trichomonas: NEGATIVE

## 2019-11-10 ENCOUNTER — Telehealth (HOSPITAL_COMMUNITY): Payer: Self-pay | Admitting: Emergency Medicine

## 2019-11-10 NOTE — Telephone Encounter (Signed)
Returned patient's call concerning his test results.  Verified identity using two identifiers and provided negative swab results.  Patient verbalized understanding.  No further action needed at this time.

## 2020-06-19 IMAGING — CR DG PELVIS 1-2V
1 series · 1 of 1 positions shown · non-contrast
Comparison: None.

CLINICAL DATA: Left lower back/buttock laceration. Fell on glass
table.

EXAM:
PELVIS - 1-2 VIEW

[t pelvis ap]
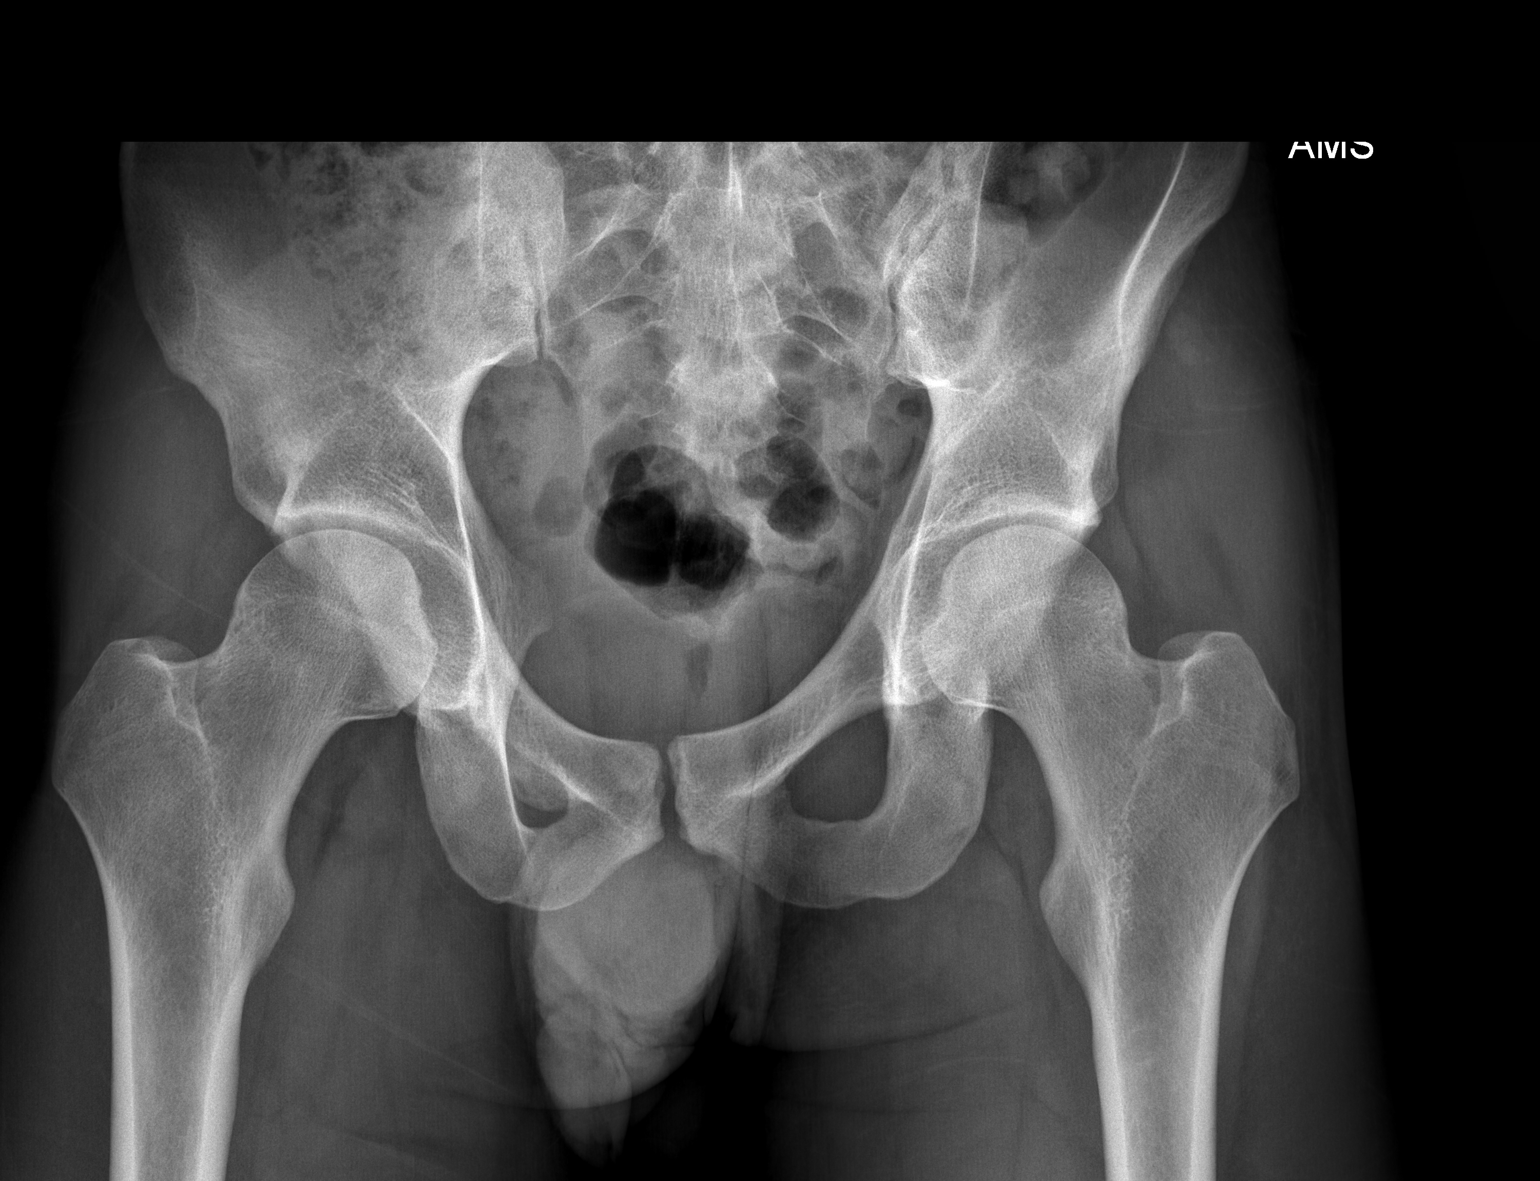

[1 of 1 positions shown; findings below may reference images not displayed]

FINDINGS: Both hips are normally located. No hip fracture or AVN. The pubic
symphysis and SI joints are intact. No pelvic fractures or bone
lesions. No obvious radiopaque foreign bodies.
IMPRESSION: No acute bony findings or radiopaque foreign body.

## 2020-06-29 ENCOUNTER — Emergency Department
Admission: EM | Admit: 2020-06-29 | Discharge: 2020-06-29 | Disposition: A | Discharge status: Home / Self Care | Payer: Managed Care, Other (non HMO) | Attending: Emergency Medicine | Admitting: Emergency Medicine

## 2020-06-29 ENCOUNTER — Other Ambulatory Visit: Payer: Self-pay

## 2020-06-29 DIAGNOSIS — Z87891 Personal history of nicotine dependence: Secondary | ICD-10-CM | POA: Insufficient documentation

## 2020-06-29 DIAGNOSIS — L539 Erythematous condition, unspecified: Secondary | ICD-10-CM | POA: Diagnosis not present

## 2020-06-29 DIAGNOSIS — T401X1A Poisoning by heroin, accidental (unintentional), initial encounter: Secondary | ICD-10-CM | POA: Diagnosis present

## 2020-06-29 DIAGNOSIS — T40601A Poisoning by unspecified narcotics, accidental (unintentional), initial encounter: Secondary | ICD-10-CM

## 2020-06-29 LAB — BASIC METABOLIC PANEL
Anion gap: 10 (ref 5–15)
BUN: 14 mg/dL (ref 6–20)
CO2: 25 mmol/L (ref 22–32)
Calcium: 8.9 mg/dL (ref 8.9–10.3)
Chloride: 106 mmol/L (ref 98–111)
Creatinine, Ser: 1.06 mg/dL (ref 0.61–1.24)
GFR, Estimated: >60 mL/min (ref >60)
Glucose, Bld: 165 mg/dL — ABNORMAL HIGH (ref 70–99)
Potassium: 4.4 mmol/L (ref 3.5–5.1)
Sodium: 141 mmol/L (ref 135–145)

## 2020-06-29 LAB — CBC WITH DIFFERENTIAL/PLATELET
Abs Immature Granulocytes: 0.04 10*3/uL (ref 0.00–0.07)
Basophils Absolute: 0 10*3/uL (ref 0.0–0.1)
Basophils Relative: 0 %
Eosinophils Absolute: 0 10*3/uL (ref 0.0–0.5)
Eosinophils Relative: 0 %
HCT: 42.5 % (ref 39.0–52.0)
Hemoglobin: 14.4 g/dL (ref 13.0–17.0)
Immature Granulocytes: 1 %
Lymphocytes Relative: 21 %
Lymphs Abs: 1.7 10*3/uL (ref 0.7–4.0)
MCH: 30.7 pg (ref 26.0–34.0)
MCHC: 33.9 g/dL (ref 30.0–36.0)
MCV: 90.6 fL (ref 80.0–100.0)
Monocytes Absolute: 0.4 10*3/uL (ref 0.1–1.0)
Monocytes Relative: 5 %
Neutro Abs: 5.9 10*3/uL (ref 1.7–7.7)
Neutrophils Relative %: 73 %
Platelets: 296 10*3/uL (ref 150–400)
RBC: 4.69 MIL/uL (ref 4.22–5.81)
RDW: 11.9 % (ref 11.5–15.5)
WBC: 8.2 10*3/uL (ref 4.0–10.5)
nRBC: 0 % (ref 0.0–0.2)

## 2020-06-29 MED ORDER — LACTATED RINGERS IV BOLUS
1000.0000 mL | Freq: Once | INTRAVENOUS | Status: AC
  Administered 2020-06-29: 1000 mL via INTRAVENOUS

## 2020-06-29 NOTE — ED Notes (Signed)
Pt given water 

## 2020-06-29 NOTE — ED Triage Notes (Signed)
Pt to ED via [facility], pt states he was out with his friends drinking tonight and states he is aware that his drink had some opiates in it but not sure what specifically it was mixed with. Per ems pt was found down by car GCS 3, pt was given 2mg  narcan and then was GCS 15, pt is GCS 15 on arrival. Pt denies any complaints.

## 2020-06-29 NOTE — ED Provider Notes (Signed)
[facility] Emergency Department Provider Note ____________________________________________   Event Date/Time   First MD Initiated Contact with Patient 06/29/20 [phone]     (approximate)  I have reviewed the triage vital signs and the nursing notes.  HISTORY  Chief Complaint Drug Overdose   HPI Adrian Schmitt is a 25 y.o. malewho presents to the ED for evaluation of overdose.   Chart review indicates hx polysubstance abuse.   Patient presents to the ED via EMS after accidental field opiate overdose.  Patient reports drinking alcohol with friends this evening, when a friend offered him a "special drink" that would "help him relax."  Patient reports that he has not consumed opiates in about 5 years, and claims he was unaware of what he was ingesting.  Aside from alcohol and his special drink with presumed opiates, he denies additional recreational drug use this evening.  EMS found the patient cyanotic GCS 3, provided 2 mg of Narcan with resumption of consciousness and maintenance of GCS 15 since that time.  Here in the ED, patient denies any acute complaints and is requesting a Advertising account planner.  Denies pain, recent illnesses or intentional overdose.  Past Medical History:  Diagnosis Date  . Polysubstance abuse (H[facility])   . Seizure ([facility])   . Seizures [facility])     Patient Active Problem List   Diagnosis Date Noted  . Wart 06/20/2017    History reviewed. No pertinent surgical history.  Prior to Admission medications   Medication Sig Start Date End Date Taking? Authorizing Provider  ibuprofen (ADVIL,MOTRIN) 200 MG tablet Take 200 mg by mouth every 6 (six) hours as needed.    [provider]    Allergies Ativan [lorazepam] and Nembutal [pentobarbital]  No family history on file.  Social History Social History   Tobacco Use  . Smoking status: Former Games developer  . Smokeless tobacco: Never Used  Vaping Use  . Vaping Use: Every day  Substance Use  Topics  . Alcohol use: Yes  . Drug use: Yes    Comment: heroin    Review of Systems  Constitutional: No fever/chills Eyes: No visual changes. ENT: No sore throat. Cardiovascular: Denies chest pain. Respiratory: Denies shortness of breath. Gastrointestinal: No abdominal pain.  No nausea, no vomiting.  No diarrhea.  No constipation. Genitourinary: Negative for dysuria. Musculoskeletal: Negative for back pain. Skin: Negative for rash. Neurological: Negative for headaches, focal weakness or numbness.  ____________________________________________   PHYSICAL EXAM:  VITAL SIGNS: Vitals:   06/29/20 0600 06/29/20 0617  BP: 136/90   Pulse:    Resp: 18   Temp:  98.4 F (36.9 C)  SpO2:       Constitutional: Alert and oriented. Well appearing and in no acute distress. Eyes: Conjunctivae are normal. PERRL. EOMI. Head: Atraumatic. Nose: No congestion/rhinnorhea. Mouth/Throat: Mucous membranes are moist.  Oropharynx non-erythematous. Neck: No stridor. No cervical spine tenderness to palpation. Cardiovascular: Normal rate, regular rhythm. Grossly normal heart sounds.  Good peripheral circulation. Respiratory: Normal respiratory effort.  No retractions. Lungs CTAB. Gastrointestinal: Soft , nondistended, nontender to palpation. No CVA tenderness. Musculoskeletal: No lower extremity tenderness nor edema.  No joint effusions. No signs of acute trauma. Erythematous breastbone from field of sternal rubbing Neurologic:  Normal speech and language. No gross focal neurologic deficits are appreciated. No gait instability noted. Skin:  Skin is warm, dry and intact. No rash noted. Psychiatric: Mood and affect are normal. Speech and behavior are normal.  ____________________________________________   LABS (all labs  ordered are listed, but only abnormal results are displayed)  Labs Reviewed  BASIC METABOLIC PANEL - Abnormal; Notable for the following components:      Result Value    Glucose, Bld 165 (*)    All other components within normal limits  CBC WITH DIFFERENTIAL/PLATELET   ____________________________________________  12 Lead EKG  Sinus rhythm, rate of 119 bpm.  Normal axis and intervals.  No evidence of acute ischemia.  Sinus tachycardia. ____________________________________________   PROCEDURES and INTERVENTIONS  Procedure(s) performed (including Critical Care):  .1-3 Lead EKG Interpretation Performed by: Delton Prairie, MD Authorized by: Delton Prairie, MD     Interpretation: abnormal     ECG rate:  115   ECG rate assessment: tachycardic     Rhythm: sinus tachycardia     Ectopy: none     Conduction: normal      Medications  lactated ringers bolus 1,000 mL (0 mLs Intravenous Stopped 06/29/20 0616)    ____________________________________________   MDM / ED COURSE   25 year old male with history of polysubstance abuse presents to the ED after accidental opiate overdose requiring field Narcan, not requiring redosing in the ED, and ultimately amenable to outpatient management.  Tachycardic on arrival, resolving after IVF, otherwise hemodynamically stable.  Exam without evidence of any acute derangements.  He looks well without distress, transfers himself from the EMS stretcher to hours, has no neurovascular deficits or signs of acute trauma.  Basic labs unremarkable.  EKG without ischemic features or interval changes to suggest a cardiac etiology of syncope.  Patient denies intentional overdose of opiates and denies suicidality.  Denies coingestions beyond alcohol.  Patient was observed for about 2.5 hours in the ED without need for redosing of Narcan.  Offered substance abuse assistance, and he declines.  No evidence of psychiatric emergency to necessitate IVC.  Return precautions for the ED were discussed and patient is medically stable for outpatient management.   Clinical Course as of 06/29/20 0643  Thu Jun 29, 2020  0501 Reassessed.  Sitting up  in bed and looks well.  Tachycardia resolved with fluids. [DS]  1610 Reassessed.  Patient sitting up in bed and looks well.  Tachycardia resolved.  He is requesting discharge.  I again asked if he would like assistance with drug rehabilitation, and he declines.  Denies suicidal intent. [DS]    Clinical Course User Index [DS] Delton Prairie, MD    ____________________________________________   FINAL CLINICAL IMPRESSION(S) / ED DIAGNOSES  Final diagnoses:  Opiate overdose, accidental or unintentional, initial encounter [facility])     ED Discharge Orders    None       Jimena Wieczorek   Note:  This document was prepared using Dragon voice recognition software and may include unintentional dictation errors.   Delton Prairie, MD 06/29/20 [phone]

## 2020-06-29 NOTE — Discharge Instructions (Addendum)
The local Police Department saved your life by giving you Narcan.   Stop using recreational opiates.  You can follow-up with [facility] services to get help with substance abuse.  Return to the ED with any additional overdoses.

## 2020-10-17 ENCOUNTER — Encounter (HOSPITAL_BASED_OUTPATIENT_CLINIC_OR_DEPARTMENT_OTHER): Payer: Self-pay

## 2020-10-17 ENCOUNTER — Other Ambulatory Visit: Payer: Self-pay

## 2020-10-17 ENCOUNTER — Emergency Department (HOSPITAL_BASED_OUTPATIENT_CLINIC_OR_DEPARTMENT_OTHER)
Admission: EM | Admit: 2020-10-17 | Discharge: 2020-10-17 | Disposition: A | Discharge status: Home / Self Care | Payer: Managed Care, Other (non HMO) | Attending: Emergency Medicine | Admitting: Emergency Medicine

## 2020-10-17 DIAGNOSIS — F17210 Nicotine dependence, cigarettes, uncomplicated: Secondary | ICD-10-CM | POA: Diagnosis not present

## 2020-10-17 DIAGNOSIS — F1123 Opioid dependence with withdrawal: Secondary | ICD-10-CM | POA: Diagnosis present

## 2020-10-17 DIAGNOSIS — F1193 Opioid use, unspecified with withdrawal: Secondary | ICD-10-CM

## 2020-10-17 MED ORDER — CLONIDINE HCL 0.1 MG PO TABS: ORAL_TABLET | ORAL | 0 refills | Status: DC

## 2020-10-17 MED ORDER — ONDANSETRON HCL 4 MG PO TABS: 4.0000 mg | ORAL_TABLET | Freq: Four times a day (QID) | ORAL | 0 refills | Status: DC

## 2020-10-17 MED ORDER — NALOXONE HCL 4 MG/0.1ML NA LIQD
1.0000 | Freq: Once | NASAL | Status: AC
  Administered 2020-10-17: 1 via NASAL
  Filled 2020-10-17: qty 4

## 2020-10-17 NOTE — ED Provider Notes (Signed)
[facility] EMERGENCY DEPT Provider Note   CSN: [id number] Arrival date & time: 10/17/20  2227     History Chief Complaint  Patient presents with   Withdrawal    Adrian Schmitt is a 25 y.o. male.  Patient is a 24 year old male who presents with heroin withdrawal symptoms.  He has a history of polysubstance abuse.  He states recently he has been using heroin with last use about 6 hours ago.  He is having some chills but no other symptoms currently.  No nausea or vomiting.  No suicidal thoughts.  No fevers.  No other recent illnesses.  He request help with detox.      Past Medical History:  Diagnosis Date   Polysubstance abuse ([facility])    Seizure ([facility])    Seizures [facility])     Patient Active Problem List   Diagnosis Date Noted   Wart 06/20/2017    History reviewed. No pertinent surgical history.     No family history on file.  Social History   Tobacco Use   Smoking status: Every Day    Pack years: 0.00    Types: Cigarettes   Smokeless tobacco: Never  Vaping Use   Vaping Use: Every day  Substance Use Topics   Alcohol use: Yes   Drug use: Yes    Comment: heroin    Home Medications Prior to Admission medications   Medication Sig Start Date End Date Taking? Authorizing Provider  cloNIDine (CATAPRES) 0.1 MG tablet 1 tab po tid x 2 days, then bid x 2 days, then once daily x 2 days 10/17/20  Yes Rolan Bucco, MD  ondansetron (ZOFRAN) 4 MG tablet Take 1 tablet (4 mg total) by mouth every 6 (six) hours. 10/17/20  Yes Rolan Bucco, MD  ibuprofen (ADVIL,MOTRIN) 200 MG tablet Take 200 mg by mouth every 6 (six) hours as needed.    [provider]    Allergies    Ativan [lorazepam] and Nembutal [pentobarbital]  Review of Systems   Review of Systems  Constitutional:  Positive for chills and fatigue. Negative for diaphoresis and fever.  HENT:  Negative for congestion, rhinorrhea and sneezing.   Eyes: Negative.   Respiratory:  Negative for cough,  chest tightness and shortness of breath.   Cardiovascular:  Negative for chest pain and leg swelling.  Gastrointestinal:  Negative for abdominal pain, blood in stool, diarrhea, nausea and vomiting.  Genitourinary:  Negative for difficulty urinating, flank pain, frequency and hematuria.  Musculoskeletal:  Negative for arthralgias and back pain.  Skin:  Negative for rash.  Neurological:  Negative for dizziness, speech difficulty, weakness, numbness and headaches.   Physical Exam Updated Vital Signs BP (!) 149/82 (BP Location: Right Arm)   Pulse 70   Temp 98.6 F (37 C) (Oral)   Resp 16   Ht 6\' 1"  (1.854 m)   Wt 67 kg   SpO2 100%   BMI 19.47 kg/m   Physical Exam Constitutional:      Appearance: He is well-developed.  HENT:     Head: Normocephalic and atraumatic.  Eyes:     Pupils: Pupils are equal, round, and reactive to light.  Cardiovascular:     Rate and Rhythm: Normal rate and regular rhythm.     Heart sounds: Normal heart sounds.  Pulmonary:     Effort: Pulmonary effort is normal. No respiratory distress.     Breath sounds: Normal breath sounds. No wheezing or rales.  Chest:     Chest wall:  No tenderness.  Abdominal:     General: Bowel sounds are normal.     Palpations: Abdomen is soft.     Tenderness: There is no abdominal tenderness. There is no guarding or rebound.  Musculoskeletal:        General: Normal range of motion.     Cervical back: Normal range of motion and neck supple.  Lymphadenopathy:     Cervical: No cervical adenopathy.  Skin:    General: Skin is warm and dry.     Findings: No rash.  Neurological:     Mental Status: He is alert and oriented to person, place, and time.    ED Results / Procedures / Treatments   Labs (all labs ordered are listed, but only abnormal results are displayed) Labs Reviewed - No data to display  EKG None  Radiology No results found.  Procedures Procedures   Medications Ordered in ED Medications  naloxone  (NARCAN) nasal spray 4 mg/0.1 mL (has no administration in time range)    ED Course  I have reviewed the triage vital signs and the nursing notes.  Pertinent labs & imaging results that were available during my care of the patient were reviewed by me and considered in my medical decision making (see chart for details).    MDM Rules/Calculators/A&P                          Patient presents with heroin withdrawal.  He has some chills but currently no other symptoms.  He denies suicidal ideations.  Peer support consult was placed.  He was given a prescription for clonidine and Zofran to use for symptomatic relief.  Return precautions were given.  He was dispensed Narcan on discharge. Final Clinical Impression(s) / ED Diagnoses Final diagnoses:  Heroin withdrawal (HCC)    Rx / DC Orders ED Discharge Orders          Ordered    cloNIDine (CATAPRES) 0.1 MG tablet        10/17/20 2311    ondansetron (ZOFRAN) 4 MG tablet  Every 6 hours        10/17/20 2311             Rolan Bucco, MD 10/17/20 2316

## 2020-10-17 NOTE — ED Triage Notes (Signed)
Pt states he is detoxing from heroin. Last use was 6 hours ago. Pt with symptoms of tremors and chills. Pt has been using heroin for last 2 months. Reports occasional xanax, drinks ETOH. Denies other substances.

## 2022-12-03 ENCOUNTER — Encounter (HOSPITAL_COMMUNITY): Payer: Self-pay

## 2022-12-03 ENCOUNTER — Ambulatory Visit (HOSPITAL_COMMUNITY)
Admission: RE | Admit: 2022-12-03 | Discharge: 2022-12-03 | Disposition: A | Discharge status: Home / Self Care | Payer: MEDICAID | Source: Ambulatory Visit | Attending: Family Medicine | Admitting: Family Medicine

## 2022-12-03 VITALS — BP 115/76 | HR 90 | Temp 99.9°F | Resp 18

## 2022-12-03 DIAGNOSIS — R110 Nausea: Secondary | ICD-10-CM

## 2022-12-03 DIAGNOSIS — R8290 Unspecified abnormal findings in urine: Secondary | ICD-10-CM

## 2022-12-03 DIAGNOSIS — R1030 Lower abdominal pain, unspecified: Secondary | ICD-10-CM | POA: Diagnosis not present

## 2022-12-03 LAB — POCT URINALYSIS DIP (MANUAL ENTRY)
Blood, UA: NEGATIVE
Glucose, UA: NEGATIVE mg/dL
Leukocytes, UA: NEGATIVE
Nitrite, UA: NEGATIVE
Protein Ur, POC: 30 mg/dL — AB
Spec Grav, UA: 1.025 (ref 1.010–1.025)
Urobilinogen, UA: 1 E.U./dL
pH, UA: 7 (ref 5.0–8.0)

## 2022-12-03 MED ORDER — CIPROFLOXACIN HCL 500 MG PO TABS: 500.0000 mg | ORAL_TABLET | Freq: Two times a day (BID) | ORAL | 0 refills | Status: AC

## 2022-12-03 MED ORDER — ONDANSETRON 4 MG PO TBDP: 4.0000 mg | ORAL_TABLET | Freq: Three times a day (TID) | ORAL | 0 refills | Status: DC | PRN

## 2022-12-03 NOTE — Discharge Instructions (Signed)
You were evaluated for your abdominal pain and urinary symptoms  Urinalysis does not show bladder infection however does show some signs of dehydration, most likely related to poor intake over the last 2 days  Abdominal pain could be related to constipation versus viral symptoms, continue to use laxatives as needed until complete bowel movement has occurred  You may use Zofran every 8 hours as needed to help minimize nausea, placed under tongue and allowed to dissolve  Until able to tolerate food at baseline please increase your fluid intake to maintain your hydration, eat food as tolerated, try blander diet to prevent stomach irritation  Urinary symptoms may be related to irritation to your prostate, began ciprofloxacin every morning and every evening for 7 days to provide coverage for bacteria  If you continue to have urinary symptoms past use of medicine please follow-up with urology which is a bladder specialist, information is listed on front page, and if your symptoms resolve you do not need to follow-up with Dr.  Janae Bridgeman follow-up with his urgent care as needed

## 2022-12-03 NOTE — ED Provider Notes (Signed)
[facility]    CSN: [id number] Arrival date & time: 12/03/22  1447      History   Chief Complaint Chief Complaint  Patient presents with   Abdominal Pain    Entered by patient    HPI Adrian Schmitt is a 27 y.o. male.   Patient presents for evaluation of centralized lower abdominal pain occurring constantly for 2 days.  Fluctuating in intensity, exacerbated by standing or stretching, at rest pain at a 3 out of 10.  Associated nausea without vomiting, has had poor food and fluid intake which she believes is causing generalized weakness and a intermittent generalized headache.  No known sick contacts.  Denies dietary changes, recent travel, does not take any daily medications.  Last bowel movement 3 to 4 days ago, has attempted use of a laxative which elicited a small bowel movement, does not feel like he completely empty but does not feel constipated.  Associated bloating.  Denies increased gas production, heartburn or indigestion, fevers or URI symptoms.  Additionally has attempted use of Tums which has been ineffective.  Endorses over the last 2 months he has had an intermittent foul odor to the urine described as asparagus, cloudy urine and urgency with incomplete bladder emptying.  Has not been evaluated for symptoms.  Sexually active, 1 partner, no concern for STD.  Denies penile discharge, penile testicle swelling, flank pain, fevers, dysuria, hematuria.  Past Medical History:  Diagnosis Date   Polysubstance abuse (HCC)    Seizure (HCC)    Seizures [facility])     Patient Active Problem List   Diagnosis Date Noted   Wart 06/20/2017    History reviewed. No pertinent surgical history.     Home Medications    Prior to Admission medications   Medication Sig Start Date End Date Taking? Authorizing Provider  cloNIDine (CATAPRES) 0.1 MG tablet 1 tab po tid x 2 days, then bid x 2 days, then once daily x 2 days 10/17/20   Rolan Bucco, MD  ibuprofen (ADVIL,MOTRIN) 200 MG  tablet Take 200 mg by mouth every 6 (six) hours as needed.    [provider]  ondansetron (ZOFRAN) 4 MG tablet Take 1 tablet (4 mg total) by mouth every 6 (six) hours. 10/17/20   Rolan Bucco, MD    Family History History reviewed. No pertinent family history.  Social History Social History   Tobacco Use   Smoking status: Every Day    Types: Cigarettes   Smokeless tobacco: Never  Vaping Use   Vaping status: Every Day  Substance Use Topics   Alcohol use: Yes   Drug use: Yes    Comment: heroin     Allergies   Ativan [lorazepam] and Nembutal [pentobarbital]   Review of Systems Review of Systems   Physical Exam Triage Vital Signs ED Triage Vitals  Encounter Vitals Group     BP 12/03/22 1511 115/76     Systolic BP Percentile --      Diastolic BP Percentile --      Pulse Rate 12/03/22 1511 90     Resp 12/03/22 1511 18     Temp 12/03/22 1511 99.9 F (37.7 C)     Temp src --      SpO2 12/03/22 1511 98 %     Weight --      Height --      Head Circumference --      Peak Flow --      Pain Score 12/03/22 1509  6     Pain Loc --      Pain Education --      Exclude from Growth Chart --    No data found.  Updated Vital Signs BP 115/76   Pulse 90   Temp 99.9 F (37.7 C)   Resp 18   SpO2 98%   Visual Acuity Right Eye Distance:   Left Eye Distance:   Bilateral Distance:    Right Eye Near:   Left Eye Near:    Bilateral Near:     Physical Exam Constitutional:      Appearance: Normal appearance.  Eyes:     Extraocular Movements: Extraocular movements intact.  Pulmonary:     Effort: Pulmonary effort is normal.  Abdominal:     General: Abdomen is flat. Bowel sounds are normal.     Palpations: Abdomen is soft.     Tenderness: There is abdominal tenderness in the right lower quadrant, periumbilical area and left lower quadrant.  Genitourinary:    Comments: deferred Neurological:     General: No focal deficit present.     Mental Status: He is  alert and oriented to person, place, and time. Mental status is at baseline.      UC Treatments / Results  Labs (all labs ordered are listed, but only abnormal results are displayed) Labs Reviewed  POCT URINALYSIS DIP (MANUAL ENTRY)    EKG   Radiology No results found.  Procedures Procedures (including critical care time)  Medications Ordered in UC Medications - No data to display  Initial Impression / Assessment and Plan / UC Course  I have reviewed the triage vital signs and the nursing notes.  Pertinent labs & imaging results that were available during my care of the patient were reviewed by me and considered in my medical decision making (see chart for details).  Lower abdominal pain, nausea without vomiting, bad odor of urine  Tenderness is noted to the abdominal exam, possibly constipation versus viral cause, vital signs are stable and patient is in no signs of distress nor toxic appearing, denies all upper respiratory symptoms therefore deferring viral testing, recommenced continued use of laxatives/stool softeners until complete bowel movement has occurred and prescribed Zofran, advised increase fluid intake until able to tolerate foods at baseline to maintain hydration, advised to monitor closely  Urinary symptoms have persisted for 2 months without resolution, urinalysis negative presence of ketone, bilirubin and protein most likely related to poor fluid intake due to nausea, Zofran prescribed and advised increase fluid intake, persisting symptoms possibly related to the prostate, discussed this with patient prophylactically providing ciprofloxacin, advised that his symptoms continue to persist post treatment that he is to follow-up with urology for further evaluation, referral given, follow-up with urgent care as needed Final Clinical Impressions(s) / UC Diagnoses   Final diagnoses:  None   Discharge Instructions   None    ED Prescriptions   None    PDMP not  reviewed this encounter.   Valinda Hoar, NP 12/03/22 [phone]

## 2022-12-03 NOTE — ED Triage Notes (Signed)
Pt presents with lower abdominal pain that started on Sunday with nausea. Pt reports not eating as much as normal.   A couple months ago he had some urinary symptoms that started improving without treatment so he did not address it. Now this stomach pain has started and he is concerned.

## 2024-05-19 ENCOUNTER — Encounter (HOSPITAL_COMMUNITY): Payer: Self-pay

## 2024-05-19 ENCOUNTER — Ambulatory Visit (HOSPITAL_COMMUNITY)
Admission: RE | Admit: 2024-05-19 | Discharge: 2024-05-19 | Disposition: A | Discharge status: Home / Self Care | Payer: MEDICAID | Source: Ambulatory Visit | Attending: Family Medicine | Admitting: Family Medicine

## 2024-05-19 ENCOUNTER — Other Ambulatory Visit: Payer: Self-pay

## 2024-05-19 VITALS — BP 137/88 | HR 91 | Temp 97.9°F | Resp 18

## 2024-05-19 DIAGNOSIS — M79642 Pain in left hand: Secondary | ICD-10-CM | POA: Diagnosis not present

## 2024-05-19 DIAGNOSIS — L03114 Cellulitis of left upper limb: Secondary | ICD-10-CM

## 2024-05-19 MED ORDER — DOXYCYCLINE HYCLATE 100 MG PO CAPS: 100.0000 mg | ORAL_CAPSULE | Freq: Two times a day (BID) | ORAL | 0 refills | Status: DC

## 2024-05-19 MED ORDER — LIDOCAINE HCL (PF) 1 % IJ SOLN
INTRAMUSCULAR | Status: AC
  Filled 2024-05-19: qty 2

## 2024-05-19 MED ORDER — CEFTRIAXONE SODIUM 1 G IJ SOLR
1.0000 g | Freq: Once | INTRAMUSCULAR | Status: AC
  Administered 2024-05-19: 1 g via INTRAMUSCULAR

## 2024-05-19 MED ORDER — CEFTRIAXONE SODIUM 1 G IJ SOLR
INTRAMUSCULAR | Status: AC
  Filled 2024-05-19: qty 10

## 2024-05-19 NOTE — Discharge Instructions (Signed)
Meds ordered this encounter  ?Medications  ? cefTRIAXone (ROCEPHIN) injection 1 g  ? doxycycline (VIBRAMYCIN) 100 MG capsule  ?  Sig: Take 1 capsule (100 mg total) by mouth 2 (two) times daily.  ?  Dispense:  14 capsule  ?  Refill:  0  ? ? ?

## 2024-05-19 NOTE — ED Provider Notes (Signed)
 " [facility]   [id number] 05/19/24 Arrival Time: 1011  ASSESSMENT & PLAN:  1. Cellulitis of left upper extremity   2. Left hand pain    Treatment: Meds ordered this encounter  Medications   cefTRIAXone  (ROCEPHIN ) injection 1 g   doxycycline  (VIBRAMYCIN ) 100 MG capsule    Sig: Take 1 capsule (100 mg total) by mouth 2 (two) times daily.    Dispense:  14 capsule    Refill:  0     Follow-up Information     [facility].   Specialty: Emergency Medicine Why: If worsening or failing to improve as anticipated. Contact information: [address] [place]  [address] [phone]                Reviewed expectations re: course of current medical issues. Questions answered. Outlined signs and symptoms indicating need for more acute intervention. Understanding verbalized. After Visit Summary given.   SUBJECTIVE: History from: Patient. Adrian Schmitt is a 29 y.o. male. Reports wrist pain.  Left wrist is swollen and painful.  Fingers are starting to get numb, tingling Patient relapsed recently.  Patient last used iv drugs 2 days ago.  Heroin; last use of L forearm several days ago after noting swelling/warmth. Denies: fever. Normal PO intake without n/v/d.  OBJECTIVE:  Vitals:   05/19/24 1028  BP: 137/88  Pulse: 91  Resp: 18  Temp: 97.9 F (36.6 C)  TempSrc: Oral  SpO2: 95%    General appearance: alert; no distress Lungs: speaks full sentences without difficulty; unlabored Extremities: mild edema of LUE around wrist and into dorsal hand mostly; all fingers with FROM and normal capillary refill and normal distal sensation; 2+ radial pulse on L Skin: warm and dry; area of heat/redness over left wrist extending into dorsal hand; no signs of abscess formation Psychological: alert and cooperative; normal mood and affect  Allergies[1]  Past Medical History:  Diagnosis Date   Polysubstance abuse  (HCC)    Seizure (HCC)    Seizures (HCC)    Social History   Socioeconomic History   Marital status: Single    Spouse name: Not on file   Number of children: Not on file   Years of education: Not on file   Highest education level: Not on file  Occupational History   Not on file  Tobacco Use   Smoking status: Former    Types: Cigarettes   Smokeless tobacco: Never  Vaping Use   Vaping status: Every Day  Substance and Sexual Activity   Alcohol use: Yes   Drug use: Yes    Comment: heroin   Sexual activity: Not on file  Other Topics Concern   Not on file  Social History Narrative   Not on file   Social Drivers of Health   Tobacco Use: Medium Risk (05/19/2024)   Patient History    Smoking Tobacco Use: Former    Smokeless Tobacco Use: Never    Passive Exposure: Not on Actuary Strain: Not on file  Food Insecurity: Not on file  Transportation Needs: Not on file  Physical Activity: Not on file  Stress: Not on file  Social Connections: Not on file  Intimate Partner Violence: Not on file  Depression (EYV7-0): Not on file  Alcohol Screen: Not on file  Housing: Not on file  Utilities: Not on file  Health Literacy: Not on file   History reviewed. No pertinent family history. History reviewed.  No pertinent surgical history.    [1]  Allergies Allergen Reactions   Ativan [Lorazepam]     Does not work, has opposite reaction    Nembutal [Pentobarbital]     Has opposite reaction     Rolinda Rogue, MD 05/19/24 1055  "

## 2024-05-19 NOTE — ED Triage Notes (Signed)
 Reports wrist pain.  Left wrist is swollen and painful.  Fingers are starting to get numb, tingling Patient relapsed recently.  Patient last used iv drugs 2 days ago.

## 2024-05-20 ENCOUNTER — Emergency Department (HOSPITAL_COMMUNITY): Payer: MEDICAID

## 2024-05-20 ENCOUNTER — Other Ambulatory Visit: Payer: Self-pay

## 2024-05-20 ENCOUNTER — Observation Stay (HOSPITAL_COMMUNITY)
Admission: EM | Admit: 2024-05-20 | Discharge: 2024-05-21 | Disposition: A | Discharge status: Home / Self Care | Payer: MEDICAID | Attending: Internal Medicine | Admitting: Internal Medicine

## 2024-05-20 DIAGNOSIS — F1910 Other psychoactive substance abuse, uncomplicated: Secondary | ICD-10-CM | POA: Diagnosis not present

## 2024-05-20 DIAGNOSIS — Z87891 Personal history of nicotine dependence: Secondary | ICD-10-CM | POA: Insufficient documentation

## 2024-05-20 DIAGNOSIS — L03114 Cellulitis of left upper limb: Secondary | ICD-10-CM | POA: Diagnosis not present

## 2024-05-20 DIAGNOSIS — M25539 Pain in unspecified wrist: Secondary | ICD-10-CM | POA: Diagnosis present

## 2024-05-20 LAB — COMPREHENSIVE METABOLIC PANEL WITH GFR
ALT: 63 U/L — ABNORMAL HIGH (ref 0–44)
AST: 77 U/L — ABNORMAL HIGH (ref 15–41)
Albumin: 4.8 g/dL (ref 3.5–5.0)
Alkaline Phosphatase: 179 U/L — ABNORMAL HIGH (ref 38–126)
Anion gap: 13 (ref 5–15)
BUN: 17 mg/dL (ref 6–20)
CO2: 26 mmol/L (ref 22–32)
Calcium: 9.8 mg/dL (ref 8.9–10.3)
Chloride: 99 mmol/L (ref 98–111)
Creatinine, Ser: 0.79 mg/dL (ref 0.61–1.24)
GFR, Estimated: >60 mL/min
Glucose, Bld: 104 mg/dL — ABNORMAL HIGH (ref 70–99)
Potassium: 4 mmol/L (ref 3.5–5.1)
Sodium: 137 mmol/L (ref 135–145)
Total Bilirubin: 0.4 mg/dL (ref 0.0–1.2)
Total Protein: 9.1 g/dL — ABNORMAL HIGH (ref 6.5–8.1)

## 2024-05-20 LAB — CBC WITH DIFFERENTIAL/PLATELET
Abs Immature Granulocytes: 0.03 K/uL (ref 0.00–0.07)
Basophils Absolute: 0 K/uL (ref 0.0–0.1)
Basophils Relative: 0 %
Eosinophils Absolute: 0.2 K/uL (ref 0.0–0.5)
Eosinophils Relative: 2 %
HCT: 41.1 % (ref 39.0–52.0)
Hemoglobin: 13.6 g/dL (ref 13.0–17.0)
Immature Granulocytes: 0 %
Lymphocytes Relative: 22 %
Lymphs Abs: 1.7 K/uL (ref 0.7–4.0)
MCH: 28.5 pg (ref 26.0–34.0)
MCHC: 33.1 g/dL (ref 30.0–36.0)
MCV: 86.2 fL (ref 80.0–100.0)
Monocytes Absolute: 0.8 K/uL (ref 0.1–1.0)
Monocytes Relative: 10 %
Neutro Abs: 5 K/uL (ref 1.7–7.7)
Neutrophils Relative %: 66 %
Platelets: 263 K/uL (ref 150–400)
RBC: 4.77 MIL/uL (ref 4.22–5.81)
RDW: 13.1 % (ref 11.5–15.5)
WBC: 7.7 K/uL (ref 4.0–10.5)
nRBC: 0 % (ref 0.0–0.2)

## 2024-05-20 LAB — CBC
HCT: 36.3 % — ABNORMAL LOW (ref 39.0–52.0)
Hemoglobin: 11.6 g/dL — ABNORMAL LOW (ref 13.0–17.0)
MCH: 27.7 pg (ref 26.0–34.0)
MCHC: 32 g/dL (ref 30.0–36.0)
MCV: 86.6 fL (ref 80.0–100.0)
Platelets: 238 K/uL (ref 150–400)
RBC: 4.19 MIL/uL — ABNORMAL LOW (ref 4.22–5.81)
RDW: 13.1 % (ref 11.5–15.5)
WBC: 7.5 K/uL (ref 4.0–10.5)
nRBC: 0 % (ref 0.0–0.2)

## 2024-05-20 LAB — CREATININE, SERUM
Creatinine, Ser: 0.66 mg/dL (ref 0.61–1.24)
GFR, Estimated: >60 mL/min

## 2024-05-20 LAB — I-STAT CG4 LACTIC ACID, ED: Lactic Acid, Venous: 1 mmol/L (ref 0.5–1.9)

## 2024-05-20 MED ORDER — KETOROLAC TROMETHAMINE 30 MG/ML IJ SOLN
30.0000 mg | Freq: Once | INTRAMUSCULAR | Status: AC
  Administered 2024-05-20: 30 mg via INTRAVENOUS
  Filled 2024-05-20: qty 1

## 2024-05-20 MED ORDER — VANCOMYCIN HCL 1500 MG/300ML IV SOLN
1500.0000 mg | Freq: Once | INTRAVENOUS | Status: AC
  Administered 2024-05-20: 1500 mg via INTRAVENOUS
  Filled 2024-05-20: qty 300

## 2024-05-20 MED ORDER — ONDANSETRON HCL 4 MG PO TABS: 4.0000 mg | ORAL_TABLET | Freq: Four times a day (QID) | ORAL | Status: DC | PRN

## 2024-05-20 MED ORDER — ACETAMINOPHEN 650 MG RE SUPP: 650.0000 mg | Freq: Four times a day (QID) | RECTAL | Status: DC | PRN

## 2024-05-20 MED ORDER — ACETAMINOPHEN 325 MG PO TABS: 650.0000 mg | ORAL_TABLET | Freq: Four times a day (QID) | ORAL | Status: DC | PRN

## 2024-05-20 MED ORDER — HYDRALAZINE HCL 25 MG PO TABS: 25.0000 mg | ORAL_TABLET | Freq: Four times a day (QID) | ORAL | Status: DC | PRN

## 2024-05-20 MED ORDER — ALBUTEROL SULFATE (2.5 MG/3ML) 0.083% IN NEBU: 2.5000 mg | INHALATION_SOLUTION | RESPIRATORY_TRACT | Status: DC | PRN

## 2024-05-20 MED ORDER — SODIUM CHLORIDE 0.9 % IV SOLN
2.0000 g | Freq: Once | INTRAVENOUS | Status: AC
  Administered 2024-05-20: 2 g via INTRAVENOUS
  Filled 2024-05-20: qty 12.5

## 2024-05-20 MED ORDER — ENOXAPARIN SODIUM 40 MG/0.4ML IJ SOSY
40.0000 mg | PREFILLED_SYRINGE | Freq: Every day | INTRAMUSCULAR | Status: DC
  Administered 2024-05-20: 40 mg via SUBCUTANEOUS
  Filled 2024-05-20: qty 0.4

## 2024-05-20 MED ORDER — PIPERACILLIN-TAZOBACTAM 3.375 G IVPB 30 MIN
3.3750 g | Freq: Once | INTRAVENOUS | Status: AC
  Administered 2024-05-20: 3.375 g via INTRAVENOUS
  Filled 2024-05-20: qty 50

## 2024-05-20 MED ORDER — SODIUM CHLORIDE 0.9 % IV SOLN
2.0000 g | Freq: Three times a day (TID) | INTRAVENOUS | Status: DC
  Administered 2024-05-20 – 2024-05-21 (×2): 2 g via INTRAVENOUS
  Filled 2024-05-20 (×2): qty 12.5

## 2024-05-20 MED ORDER — ONDANSETRON HCL 4 MG/2ML IJ SOLN: 4.0000 mg | Freq: Four times a day (QID) | INTRAMUSCULAR | Status: DC | PRN

## 2024-05-20 MED ORDER — VANCOMYCIN HCL IN DEXTROSE 1-5 GM/200ML-% IV SOLN: 1000.0000 mg | Freq: Once | INTRAVENOUS | Status: DC

## 2024-05-20 MED ORDER — KETOROLAC TROMETHAMINE 15 MG/ML IJ SOLN
15.0000 mg | Freq: Three times a day (TID) | INTRAMUSCULAR | Status: DC
  Administered 2024-05-20 – 2024-05-21 (×3): 15 mg via INTRAVENOUS
  Filled 2024-05-20 (×3): qty 1

## 2024-05-20 MED ORDER — OXYCODONE HCL 5 MG PO TABS
5.0000 mg | ORAL_TABLET | ORAL | Status: DC | PRN
  Administered 2024-05-20: 5 mg via ORAL
  Filled 2024-05-20: qty 1

## 2024-05-20 MED ORDER — VANCOMYCIN HCL IN DEXTROSE 1-5 GM/200ML-% IV SOLN
1000.0000 mg | Freq: Two times a day (BID) | INTRAVENOUS | Status: DC
  Administered 2024-05-21: 1000 mg via INTRAVENOUS
  Filled 2024-05-20 (×2): qty 200

## 2024-05-20 NOTE — ED Provider Notes (Signed)
 " [place] [facility] AT [facility] Provider Note   CSN: [id number] Arrival date & time: 05/20/24  9150     Patient presents with: Wrist Pain   Adrian Schmitt is a 29 y.o. male.   HPI Patient history of IV injection of heroin.  He believes that he had a bad injection or missed his vein on the left arm.  He started to get stiffness and aching in his wrist about 3 days ago.  At first he thought maybe it was just some kind of a wrist sprain but then it became red and exquisitely painful.  He was seen at urgent care yesterday and given an IV dose of Rocephin  and started on doxycycline .  Patient reports that since yesterday the redness and swelling has expanded quickly and it is much more painful to any kind of motion at the wrist or hand.  Patient denies he developed any fever.  No nausea or vomiting.  No chest pain no shortness of breath.  Patient denies other medical problems.    Prior to Admission medications  Medication Sig Start Date End Date Taking? Authorizing Provider  doxycycline  (VIBRAMYCIN ) 100 MG capsule Take 1 capsule (100 mg total) by mouth 2 (two) times daily. 05/19/24   Rolinda Rogue, MD    Allergies: Ativan [lorazepam] and Nembutal [pentobarbital]    Review of Systems  Updated Vital Signs BP (!) 149/89 (BP Location: Right Arm)   Pulse 62   Temp 98 F (36.7 C) (Oral)   Resp 16   SpO2 97%   Physical Exam Constitutional:      Comments: Alert nontoxic clear mental status no respiratory distress  HENT:     Mouth/Throat:     Pharynx: Oropharynx is clear.  Eyes:     Extraocular Movements: Extraocular movements intact.  Cardiovascular:     Rate and Rhythm: Normal rate and regular rhythm.     Heart sounds: Normal heart sounds.  Pulmonary:     Effort: Pulmonary effort is normal.     Breath sounds: Normal breath sounds.  Musculoskeletal:     Comments: Patient has moderate swelling of the dorsum of the left hand and wrist.  The dorsum of the hand  the proximal one third of the wrist more oriented towards the dorsal aspect are red and warm to the touch.  Patient has some swelling and erythema wrapping to the medial aspect of the wrist as well.  Patient can open and close the hand although it is painful.  Exquisitely painful to flex or extend at the wrist.  At this time there is no streaking up to the elbow.  Patient has 1 track mark where he believes the injection went wrong.  There is no associated abscess.  Skin:    General: Skin is warm and dry.  Neurological:     General: No focal deficit present.     Mental Status: He is oriented to person, place, and time.     Motor: No weakness.     Coordination: Coordination normal.           (all labs ordered are listed, but only abnormal results are displayed) Labs Reviewed  COMPREHENSIVE METABOLIC PANEL WITH GFR - Abnormal; Notable for the following components:      Result Value   Glucose, Bld 104 (*)    Total Protein 9.1 (*)    AST 77 (*)    ALT 63 (*)    Alkaline Phosphatase 179 (*)  All other components within normal limits  CULTURE, BLOOD (ROUTINE X 2)  CULTURE, BLOOD (ROUTINE X 2)  CBC WITH DIFFERENTIAL/PLATELET  I-STAT CG4 LACTIC ACID, ED  I-STAT CG4 LACTIC ACID, ED    EKG: None  Radiology: DG Wrist Complete Left Result Date: 05/20/2024 CLINICAL DATA:  Left wrist pain and swelling for 1 week EXAM: LEFT WRIST - COMPLETE 3+ VIEW COMPARISON:  None Available. FINDINGS: There is no evidence of fracture or dislocation. There is no evidence of arthropathy or other focal bone abnormality. Small linear metallic density is seen in volar subcutaneous tissues of the wrist consistent with foreign body. IMPRESSION: No fracture or dislocation is noted. Small linear metallic density is seen in volar subcutaneous tissues of the wrist consistent with foreign body. Electronically Signed   By: Lynwood Landy Raddle M.D.   On: 05/20/2024 10:30     Procedures   Medications Ordered in the ED   vancomycin  (VANCOREADY) IVPB 1500 mg/300 mL (1,500 mg Intravenous New Bag/Given 05/20/24 1440)  piperacillin -tazobactam (ZOSYN ) IVPB 3.375 g (0 g Intravenous Stopped 05/20/24 1441)  ketorolac  (TORADOL ) 30 MG/ML injection 30 mg (30 mg Intravenous Given 05/20/24 1421)                                    Medical Decision Making Amount and/or Complexity of Data Reviewed Radiology: ordered.  Risk Prescription drug management. Decision regarding hospitalization.   Patient presents as outlined.  He started developing symptoms about 3 days ago and despite starting treatment yesterday with an IV dose of Rocephin  and doxycycline , he has experienced rapidly increasing redness, swelling and pain of the left wrist.  He reports that he thought maybe he had broken a needle or miss injected.  He did not know this for fact.  There is no focus of actual skin wound or abscess on exam.  Three-view xrays obtained that show a very small superficial foreign body.  I have personally reviewed this.  This does not correspond with any injection site or any probable or suspicious foreign body.  There is no soft tissue issue corresponding with this location.  Consult: Orthopedics Michael for consult in the ED.  Okay to admit to [facility].  Consult:[facility] hospitalist for admission     Final diagnoses:  Cellulitis of left upper extremity  Drug abuse, IV [facility])    ED Discharge Orders     None          Armenta Canning, MD 05/20/24 1524  "

## 2024-05-20 NOTE — Progress Notes (Signed)
 Pharmacy Antibiotic Note  Adrian Schmitt is a 29 y.o. male admitted on 05/20/2024 with left wrist pain. Patient reports last IVDU (heroin) two days prior to admission. He is concerned that he may have missed his vein during injection. Started to have stiffness and aching. Was seen at Urgent Care day prior to admission, given a dose of ceftriaxone  and prescribed doxycycline . Xray with small linear metallic density seen in volar subcutaneous tissues consistent with foreign body. Orthopedic hand surgery consulted and no plan for surgical intervention or need to remove foreign body. Pharmacy has been consulted for vancomycin  and cefepime  dosing.  Plan: -Vancomycin  1500 mg given; continue with 1000 mg IV q12h -Cefepime  2 g IV q8h  Height: 6' (182.9 cm) Weight: 62 kg (136 lb 11 oz) IBW/kg (Calculated) : 77.6  Temp (24hrs), Avg:97.9 F (36.6 C), Min:97.6 F (36.4 C), Max:98.1 F (36.7 C)  Recent Labs  Lab 05/20/24 0949 05/20/24 1004 05/20/24 1636  WBC  --  7.7 7.5  CREATININE  --  0.79 0.66  LATICACIDVEN 1.0  --   --     Estimated Creatinine Clearance: 119.5 mL/min (by C-G formula based on SCr of 0.66 mg/dL).    Allergies[1]  Antimicrobials this admission: Vancomycin  1/15 >> Cefepime  1/15 >>  Dose adjustments this admission: NA  Microbiology results: 1/15 BCx: pending   Thank you for allowing pharmacy to be a part of this patients care.  [name], PharmD, BCPS Clinical Pharmacist 05/20/2024 6:22 PM      [1]  Allergies Allergen Reactions   Ativan [Lorazepam]     Does not work, has opposite reaction    Nembutal [Pentobarbital]     Has opposite reaction

## 2024-05-20 NOTE — H&P (Signed)
 " History and Physical    Patient: Adrian Schmitt FMW:[id number] DOB: Jul 30, 1995 DOA: 05/20/2024 DOS: the patient was seen and examined on 05/20/2024 PCP: Patient, No Pcp Per  Patient coming from: Home  Chief Complaint: Left wrist pain, recent IVDU Chief Complaint  Patient presents with   Wrist Pain   HPI: Adrian Schmitt is a 29 y.o. male with medical history significant of active IVDU with heroin who presented to [facility] ED on 05/20/2023 with left wrist pain, swelling.  Reports last IVDU 2 days ago.  Seen at urgent care yesterday and was given doxycycline  and ceftriaxone  but despite symptoms have progressed and reports significantly painful to touch or with any type of movement.  Does endorse fever.  Denies chills, no nausea/vomiting, no chest pain, no shortness of breath, no abdominal pain.  In the ED, temperature 98.1 F, HR 76, RR 16, BP 154/81, SpO2 99% on room air.  WBC 7.7, hemoglobin 13.6, platelet count 263.  Sodium 137, potassium 4.0, chloride 99, CO2 26, glucose 104, BUN 17, creatinine 0.79.  AST 77, ALT 63, total Ruben 0.4.  Lactic acid 1.0.  Blood cultures x 2 obtained.  Left wrist x-ray with no fracture or dislocation noted, small linear metallic density seen in the volar subcutaneous tissues of the wrist consistent with foreign body.  Orthopedic hand surgery consulted, no recommendations for surgical intervention at this time; and no need for removal of foreign body.  Patient was started on IV vancomycin , Zosyn  and given 1 dose of Toradol .  [facility] consulted for admission for further evaluation and management.   Review of Systems: As mentioned in the history of present illness. All other systems reviewed and are negative. Past Medical History:  Diagnosis Date   Polysubstance abuse (HCC)    Seizure (HCC)    Seizures (HCC)    No past surgical history on file. Social History:  reports that he has quit smoking. His smoking use included cigarettes. He has never used smokeless  tobacco. He reports current alcohol use. He reports current drug use.  Allergies[1]  No family history on file.  Prior to Admission medications  Medication Sig Start Date End Date Taking? Authorizing Provider  doxycycline  (VIBRAMYCIN ) 100 MG capsule Take 1 capsule (100 mg total) by mouth 2 (two) times daily. 05/19/24   Rolinda Rogue, MD    Physical Exam: Vitals:   05/20/24 0856 05/20/24 1200  BP: (!) 154/81 (!) 149/89  Pulse: 76 62  Resp: 16 16  Temp: 98.1 F (36.7 C) 98 F (36.7 C)  TempSrc: Oral Oral  SpO2: 99% 97%    GEN: 29 yo male in NAD, alert and oriented x 3, wd/wn HEENT: NCAT, PERRL, EOMI, sclera clear, MMM PULM: CTAB w/o wheezes/crackles, normal respiratory effort, on room air CV: RRR w/o M/G/R GI: abd soft, NTND, + BS MSK: Left hand/wrist with edema, erythema extending proximally to mid forearm, slight TTP, noted track marks, no fluctuance, no crepitus, + moderate pain with AROM wrist and fingers, as depicted below NEURO: CN II-XII intact, no focal deficits, sensation to light touch intact PSYCH: normal mood/affect Integumentary: Left upper extremity as stated above and depicted below, otherwise no other concerning rashes/lesions/wounds noted on exposed skin surfaces       Assessment and Plan:  Left upper extremity cellulitis 2/2 IVDU Patient presenting to the ED with progressive swelling, erythema, pain to his left hand/wrist, now progressing to his left forearm.  Reports IVDU 2 days prior to admission.  Did see  urgent care day prior and was given doxycycline  and ceftriaxone ; symptoms continued to persist.  Patient is afebrile without leukocytosis.  Lactic acid within normal limits.  Left wrist x-ray with no fracture or dislocation, small linear metallic density seen in the volar subcutaneous tissues of the wrist consistent with foreign body.  Orthopedic hand surgery was consulted, given no concern for tenosynovitis, septic joint or abscess; no surgical  intervention indicated at this time and no indication to remove foreign body; recommended medical admission for IV antibiotics. -- Admit to MedSurg -- Vancomycin  -- Cefepime  -- Toradol  15 mg IV every 8 hours x 5 days -- Tylenol  650 mams p.o. every 6 hours as needed mild pain -- Oxycodone  5 mg p.o. every 4 hours as needed moderate pain -- Continue to monitor left torsionally closely, if has further pain, swelling, progressive erythema will need further advanced imaging with CT with contrast -- CBC in a.m.  IVDU Recent heroin use 2 days prior to admission. -- COWS -- TOC for subs abuse resources     Advance Care Planning:   Code Status: Full Code   Consults: Orthopedic hand surgery  Family Communication: No family present at time of admission  Severity of Illness: The appropriate patient status for this patient is OBSERVATION. Observation status is judged to be reasonable and necessary in order to provide the required intensity of service to ensure the patient's safety. The patient's presenting symptoms, physical exam findings, and initial radiographic and laboratory data in the context of their medical condition is felt to place them at decreased risk for further clinical deterioration. Furthermore, it is anticipated that the patient will be medically stable for discharge from the hospital within 2 midnights of admission.   Author: Camellia PARAS Karime Scheuermann, DO 05/20/2024 3:32 PM  For on call review [email or web address].      [1]  Allergies Allergen Reactions   Ativan [Lorazepam]     Does not work, has opposite reaction    Nembutal [Pentobarbital]     Has opposite reaction   "

## 2024-05-20 NOTE — Consult Note (Signed)
 Reason for Consult:LUE cellulitis Referring Physician: Ludivina Shines Time called: 1435 Time at bedside: 1458   Adrian Schmitt is an 29 y.o. male.  HPI: Adrian Schmitt developed pain and redness in his left hand about a week ago.It got much worse over the last few days and he came to the ED for evaluation. Given the location hand surgery was consulted. 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  Past Medical History:  Diagnosis Date   Polysubstance abuse (HCC)    Seizure (HCC)    Seizures (HCC)     No past surgical history on file.  No family history on file.  Social History:  reports that he has quit smoking. His smoking use included cigarettes. He has never used smokeless tobacco. He reports current alcohol use. He reports current drug use.  Allergies: Allergies[1]  Medications: I have reviewed the patient's current medications.  Results for orders placed or performed during the hospital encounter of 05/20/24 (from the past 48 hours)  Culture, blood (routine x 2)     Status: None (Preliminary result)   Collection Time: 05/20/24  9:42 AM   Specimen: BLOOD RIGHT FOREARM  Result Value Ref Range   Specimen Description      BLOOD RIGHT FOREARM Performed at [facility], [address] [place], [place] [address]    Special Requests      BOTTLES DRAWN AEROBIC AND ANAEROBIC Blood Culture results may not be optimal due to an inadequate volume of blood received in culture bottles Performed at [facility], [address] [place], [place] [address]    Culture PENDING    Report Status PENDING   I-Stat Lactic Acid     Status: None   Collection Time: 05/20/24  9:49 AM  Result Value Ref Range   Lactic Acid, Venous 1.0 0.5 - 1.9 mmol/L  Comprehensive metabolic panel     Status: Abnormal   Collection Time: 05/20/24 10:04 AM  Result Value Ref Range   Sodium 137 135 - 145 mmol/L   Potassium 4.0 3.5 - 5.1 mmol/L   Chloride 99 98 - 111 mmol/L   CO2 26 22  - 32 mmol/L   Glucose, Bld 104 (H) 70 - 99 mg/dL    Comment: Glucose reference range applies only to samples taken after fasting for at least 8 hours.   BUN 17 6 - 20 mg/dL   Creatinine, Ser 9.20 0.61 - 1.24 mg/dL   Calcium 9.8 8.9 - 89.6 mg/dL   Total Protein 9.1 (H) 6.5 - 8.1 g/dL   Albumin 4.8 3.5 - 5.0 g/dL   AST 77 (H) 15 - 41 U/L   ALT 63 (H) 0 - 44 U/L   Alkaline Phosphatase 179 (H) 38 - 126 U/L   Total Bilirubin 0.4 0.0 - 1.2 mg/dL   GFR, Estimated >39 >39 mL/min    Comment: (NOTE) Calculated using the CKD-EPI Creatinine Equation (2021)    Anion gap 13 5 - 15    Comment: Performed at [facility], [address] [place], [place] [address]  CBC with Differential     Status: None   Collection Time: 05/20/24 10:04 AM  Result Value Ref Range   WBC 7.7 4.0 - 10.5 K/uL   RBC 4.77 4.22 - 5.81 MIL/uL   Hemoglobin 13.6 13.0 - 17.0 g/dL   HCT 58.8 60.9 - 47.9 %   MCV 86.2 80.0 - 100.0 fL   MCH 28.5 26.0 -  34.0 pg   MCHC 33.1 30.0 - 36.0 g/dL   RDW 86.8 88.4 - 84.4 %   Platelets 263 150 - 400 K/uL   nRBC 0.0 0.0 - 0.2 %   Neutrophils Relative % 66 %   Neutro Abs 5.0 1.7 - 7.7 K/uL   Lymphocytes Relative 22 %   Lymphs Abs 1.7 0.7 - 4.0 K/uL   Monocytes Relative 10 %   Monocytes Absolute 0.8 0.1 - 1.0 K/uL   Eosinophils Relative 2 %   Eosinophils Absolute 0.2 0.0 - 0.5 K/uL   Basophils Relative 0 %   Basophils Absolute 0.0 0.0 - 0.1 K/uL   Immature Granulocytes 0 %   Abs Immature Granulocytes 0.03 0.00 - 0.07 K/uL    Comment: Performed at [facility], [address] [place], [place] [address]  Culture, blood (routine x 2)     Status: None (Preliminary result)   Collection Time: 05/20/24 10:06 AM   Specimen: BLOOD RIGHT FOREARM  Result Value Ref Range   Specimen Description      BLOOD RIGHT FOREARM Performed at [facility], [address] [place], [place] [address]    Special Requests      BOTTLES DRAWN AEROBIC AND  ANAEROBIC Blood Culture adequate volume Performed at [facility], [address] [place], [place] [address]    Culture PENDING    Report Status PENDING     DG Wrist Complete Left Result Date: 05/20/2024 CLINICAL DATA:  Left wrist pain and swelling for 1 week EXAM: LEFT WRIST - COMPLETE 3+ VIEW COMPARISON:  None Available. FINDINGS: There is no evidence of fracture or dislocation. There is no evidence of arthropathy or other focal bone abnormality. Small linear metallic density is seen in volar subcutaneous tissues of the wrist consistent with foreign body. IMPRESSION: No fracture or dislocation is noted. Small linear metallic density is seen in volar subcutaneous tissues of the wrist consistent with foreign body. Electronically Signed   By: Lynwood Landy Raddle M.D.   On: 05/20/2024 10:30    Review of Systems  Constitutional:  Negative for chills, diaphoresis and fever.  HENT:  Negative for ear discharge, ear pain, hearing loss and tinnitus.   Eyes:  Negative for photophobia and pain.  Respiratory:  Negative for cough and shortness of breath.   Cardiovascular:  Negative for chest pain.  Gastrointestinal:  Negative for abdominal pain, nausea and vomiting.  Genitourinary:  Negative for dysuria, flank pain, frequency and urgency.  Musculoskeletal:  Positive for arthralgias (Left hand/FA). Negative for back pain, myalgias and neck pain.  Neurological:  Negative for dizziness and headaches.  Hematological:  Does not bruise/bleed easily.  Psychiatric/Behavioral:  The patient is not nervous/anxious.    Blood pressure (!) 149/89, pulse 62, temperature 98 F (36.7 C), temperature source Oral, resp. rate 16, SpO2 97%. Physical Exam Constitutional:      General: He is not in acute distress.    Appearance: He is well-developed. He is not diaphoretic.  HENT:     Head: Normocephalic and atraumatic.  Eyes:     General: No scleral icterus.       Right eye: No discharge.        Left  eye: No discharge.     Conjunctiva/sclera: Conjunctivae normal.  Cardiovascular:     Rate and Rhythm: Normal rate and regular rhythm.  Pulmonary:     Effort: Pulmonary effort is normal. No respiratory distress.  Musculoskeletal:     Cervical  back: Normal range of motion.     Comments: Left shoulder, elbow, wrist, digits- no skin wounds, mild TTP dorsum of hand and distal FA, able to AROM fingers/wrist with mod pain, less so with PROM, no instability, no blocks to motion  Sens  Ax/R/M/U intact  Mot   Ax/ R/ PIN/ M/ AIN/ U intact  Rad 2+  Skin:    General: Skin is warm and dry.  Neurological:     Mental Status: He is alert.  Psychiatric:        Mood and Affect: Mood normal.        Behavior: Behavior normal.     Assessment/Plan: LUE cellulitis -- No e/o flex tenosynovitis, septic joint, or abscess. Ext tenosynovitis is rarely surgical. Recommend admission to medicine for IV abx.    Adrian DOROTHA Ned, PA-C Orthopedic Surgery [phone] 05/20/2024, 3:17 PM     [1]  Allergies Allergen Reactions   Ativan [Lorazepam]     Does not work, has opposite reaction    Nembutal [Pentobarbital]     Has opposite reaction

## 2024-05-20 NOTE — ED Triage Notes (Signed)
 Pt reports relapsed on heroin last week, was shooting , missed vein and started having swelling to left wrist , pt reports its more swollen and he did received abx from UC yesterday.  On assessment, left wrist is swollen from forearm to hand, pt able to move fingers but not wrist.

## 2024-05-20 NOTE — Progress Notes (Signed)
 Pharmacy Note   A consult was received from an ED physician for vancomycin  per pharmacy dosing.    The patient's profile has been reviewed for ht/wt/allergies/indication/available labs.    A one time order has been placed for vancomycin  1500 mg IV x1 .    Further antibiotics/pharmacy consults should be ordered by admitting physician if indicated.                       Thank you,  Bellarae Lizer, PharmD, BCPS 05/20/2024 1:49 PM

## 2024-05-20 NOTE — ED Provider Triage Note (Signed)
 Emergency Medicine Provider Triage Evaluation Note  Broderick DELENA Gainer , a 29 y.o. male  was evaluated in triage.  Pt complains of left wrist pain, swelling, +heroin use, last used yesterday, no fever, started doxycycline  yesterday, still worsening, also had shot yesterday at [facility].  Review of Systems  Positive: Left wrist swelling, pain  Negative: Fever, vomiting  Physical Exam  BP (!) 154/81 (BP Location: Right Arm)   Pulse 76   Temp 98.1 F (36.7 C) (Oral)   Resp 16   SpO2 99%  Gen:   Awake, no distress    Resp:  Normal effort   MSK:   Moves extremities without difficulty left wrist redness, swelling, pain with ROM Other:     Medical Decision Making  Medically screening exam initiated at 9:15 AM.  Appropriate orders placed.  Efrem A Waln was informed that the remainder of the evaluation will be completed by another provider, this initial triage assessment does not replace that evaluation, and the importance of remaining in the ED until their evaluation is complete.      Lenor Hollering, MD 05/20/24 [phone]

## 2024-05-21 ENCOUNTER — Other Ambulatory Visit (HOSPITAL_COMMUNITY): Payer: Self-pay

## 2024-05-21 ENCOUNTER — Telehealth (HOSPITAL_COMMUNITY): Payer: Self-pay

## 2024-05-21 ENCOUNTER — Encounter (HOSPITAL_COMMUNITY): Payer: Self-pay | Admitting: Internal Medicine

## 2024-05-21 DIAGNOSIS — F1110 Opioid abuse, uncomplicated: Secondary | ICD-10-CM

## 2024-05-21 DIAGNOSIS — L03114 Cellulitis of left upper limb: Secondary | ICD-10-CM | POA: Diagnosis not present

## 2024-05-21 LAB — HIV ANTIBODY (ROUTINE TESTING W REFLEX): HIV Screen 4th Generation wRfx: NONREACTIVE

## 2024-05-21 MED ORDER — LINEZOLID 600 MG PO TABS
600.0000 mg | ORAL_TABLET | Freq: Two times a day (BID) | ORAL | 0 refills | Status: AC
  Filled 2024-05-21: qty 28, 14d supply, fill #0

## 2024-05-21 MED ORDER — CIPROFLOXACIN HCL 750 MG PO TABS
750.0000 mg | ORAL_TABLET | Freq: Two times a day (BID) | ORAL | 0 refills | Status: AC
  Filled 2024-05-21: qty 28, 14d supply, fill #0

## 2024-05-21 MED ORDER — CIPROFLOXACIN HCL 500 MG PO TABS
750.0000 mg | ORAL_TABLET | Freq: Two times a day (BID) | ORAL | Status: DC
  Administered 2024-05-21: 750 mg via ORAL
  Filled 2024-05-21: qty 2

## 2024-05-21 MED ORDER — LINEZOLID 600 MG PO TABS: 600.0000 mg | ORAL_TABLET | Freq: Two times a day (BID) | ORAL | Status: DC

## 2024-05-21 NOTE — Progress Notes (Signed)
 " PROGRESS NOTE    Adrian Schmitt  FMW:[id number] DOB: [birth year] DOA: 05/20/2024 PCP: Patient, No Pcp Per    Brief Narrative:   Adrian Schmitt is a 29 y.o. male with past medical history significant for active IVDU with heroin who presented to [facility] ED on 05/20/2023 with left wrist pain, swelling.  Reports last IVDU 2 days ago.  Seen at urgent care yesterday and was given doxycycline  and ceftriaxone  but despite symptoms have progressed and reports significantly painful to touch or with any type of movement.  Does endorse fever.  Denies chills, no nausea/vomiting, no chest pain, no shortness of breath, no abdominal pain.   In the ED, temperature 98.1 F, HR 76, RR 16, BP 154/81, SpO2 99% on room air.  WBC 7.7, hemoglobin 13.6, platelet count 263.  Sodium 137, potassium 4.0, chloride 99, CO2 26, glucose 104, BUN 17, creatinine 0.79.  AST 77, ALT 63, total Ruben 0.4.  Lactic acid 1.0.  Blood cultures x 2 obtained.  Left wrist x-ray with no fracture or dislocation noted, small linear metallic density seen in the volar subcutaneous tissues of the wrist consistent with foreign body.  Orthopedic hand surgery consulted, no recommendations for surgical intervention at this time; and no need for removal of foreign body.  Patient was started on IV vancomycin , Zosyn  and given 1 dose of Toradol .  TRH consulted for admission for further evaluation and management.  Assessment & Plan:   Left upper extremity cellulitis 2/2 IVDU Retained metallic foreign body Patient presenting to the ED with progressive swelling, erythema, pain to his left hand/wrist, now progressing to his left forearm.  Reports IVDU 2 days prior to admission.  Did see urgent care day prior and was given doxycycline  and ceftriaxone ; symptoms continued to persist.  Patient is afebrile without leukocytosis.  Lactic acid within normal limits.  Left wrist x-ray with no fracture or dislocation, small linear metallic density seen in the volar  subcutaneous tissues of the wrist consistent with foreign body.  Seen by Orthopedic hand surgery, Dr. Larayne; given no concern for tenosynovitis, septic joint or abscess; no surgical intervention indicated at this time and no indication to remove foreign body -- Infectious disease consulted -- Linezolid  600 mg p.o. every 12 hours -- Ciprofloxacin  750 mg p.o. twice daily -- Toradol  15 mg IV every 8 hours x 5 days -- Tylenol  650 mams p.o. every 6 hours as needed mild pain -- Oxycodone  5 mg p.o. every 4 hours as needed moderate pain -- Continue to monitor left torsionally closely, if has further pain, swelling, progressive erythema will need further advanced imaging with CT with contrast -- CBC in a.m.   IVDU Recent heroin use 2 days prior to admission. -- COWS -- TOC for subs abuse resources   DVT prophylaxis: enoxaparin  (LOVENOX ) injection 40 mg Start: 05/20/24 2200    Code Status: Full Code Family Communication: No family present at bedside this morning  Disposition Plan:  Level of care: Med-Surg Status is: Observation The patient remains OBS appropriate and will d/c before 2 midnights.    Consultants:  Orthopedic hand surgery, Dr. Larayne; Infectious disease  Procedures:  None  Antimicrobials:  Vancomycin  1/15 - 1/15 Cefepime  1/15 - 1/16 Zosyn  1/15 - 1/15 Linezolid  1/16>> Ciprofloxacin  1/16>>   Subjective: Patient seen examined bedside, lying in bed.  Reports pain to left wrist/forearm much improved.  Remains on IV antibiotics.  Consulted ID for further recommendations regarding antibiotics and duration on discharge.  Patient with  no other sputa complaints, concerns or questions at this time.  Denies headache, no dizziness, no chest pain, no palpitation, no shortness of breath, no abdominal pain, no fever/chills/night sweats, no nausea/vomiting/diarrhea, no focal weakness, no fatigue, no paresthesia.  No acute events overnight per nurse staff.  Objective: Vitals:    05/20/24 1802 05/20/24 2212 05/21/24 0004 05/21/24 0627  BP:  (!) 146/81 132/81 129/75  Pulse:  66 78 71  Resp:  16 17 16   Temp:  98.2 F (36.8 C) 97.9 F (36.6 C) 98.1 F (36.7 C)  TempSrc:  Oral Oral Oral  SpO2:  100% 96% 100%  Weight: 62 kg     Height: 6' (1.829 m)       Intake/Output Summary (Last 24 hours) at 05/21/2024 1113 Last data filed at 05/21/2024 9357 Gross per 24 hour  Intake 1687.67 ml  Output --  Net 1687.67 ml   Filed Weights   05/20/24 1802  Weight: 62 kg    Examination:  Physical Exam: GEN: NAD, alert and oriented x 3, wd/wn HEENT: NCAT, PERRL, EOMI, sclera clear, MMM PULM: CTAB w/o wheezes/crackles, normal respiratory effort, on room air CV: RRR w/o M/G/R GI: abd soft, NTND, + BS MSK: Left hand/wrist with edema, erythema extending proximally to mid forearm, slight TTP, noted track marks, no fluctuance, no crepitus, + mild pain with AROM wrist and fingers, as depicted below NEURO: CN II-XII intact, no focal deficits, sensation to light touch intact PSYCH: normal mood/affect Integumentary: Left upper extremity as stated above and depicted below, otherwise no other concerning rashes/lesions/wounds noted on exposed skin surfaces       Data Reviewed: I have personally reviewed following labs and imaging studies  CBC: Recent Labs  Lab 05/20/24 1004 05/20/24 1636  WBC 7.7 7.5  NEUTROABS 5.0  --   HGB 13.6 11.6*  HCT 41.1 36.3*  MCV 86.2 86.6  PLT 263 238   Basic Metabolic Panel: Recent Labs  Lab 05/20/24 1004 05/20/24 1636  NA 137  --   K 4.0  --   CL 99  --   CO2 26  --   GLUCOSE 104*  --   BUN 17  --   CREATININE 0.79 0.66  CALCIUM 9.8  --    GFR: Estimated Creatinine Clearance: 119.5 mL/min (by C-G formula based on SCr of 0.66 mg/dL). Liver Function Tests: Recent Labs  Lab 05/20/24 1004  AST 77*  ALT 63*  ALKPHOS 179*  BILITOT 0.4  PROT 9.1*  ALBUMIN 4.8   No results for input(s): LIPASE, AMYLASE in the last 168  hours. No results for input(s): AMMONIA in the last 168 hours. Coagulation Profile: No results for input(s): INR, PROTIME in the last 168 hours. Cardiac Enzymes: No results for input(s): CKTOTAL, CKMB, CKMBINDEX, TROPONINI in the last 168 hours. BNP (last 3 results) No results for input(s): PROBNP in the last 8760 hours. HbA1C: No results for input(s): HGBA1C in the last 72 hours. CBG: No results for input(s): GLUCAP in the last 168 hours. Lipid Profile: No results for input(s): CHOL, HDL, LDLCALC, TRIG, CHOLHDL, LDLDIRECT in the last 72 hours. Thyroid Function Tests: No results for input(s): TSH, T4TOTAL, FREET4, T3FREE, THYROIDAB in the last 72 hours. Anemia Panel: No results for input(s): VITAMINB12, FOLATE, FERRITIN, TIBC, IRON, RETICCTPCT in the last 72 hours. Sepsis Labs: Recent Labs  Lab 05/20/24 0949  LATICACIDVEN 1.0    Recent Results (from the past 240 hours)  Culture, blood (routine x 2)  Status: None (Preliminary result)   Collection Time: 05/20/24  9:42 AM   Specimen: BLOOD RIGHT FOREARM  Result Value Ref Range Status   Specimen Description   Final    BLOOD RIGHT FOREARM Performed at [facility], [address], [place], [place] [address]    Special Requests   Final    BOTTLES DRAWN AEROBIC AND ANAEROBIC Blood Culture results may not be optimal due to an inadequate volume of blood received in culture bottles Performed at [facility], [address] [place], [place] [address]    Culture   Final    NO GROWTH < 24 HOURS Performed at [facility], [address], [place], [place] [address]    Report Status PENDING  Incomplete  Culture, blood (routine x 2)     Status: None (Preliminary result)   Collection Time: 05/20/24 10:06 AM   Specimen: BLOOD RIGHT FOREARM  Result Value Ref Range Status   Specimen Description   Final    BLOOD RIGHT FOREARM Performed at [facility], [address], [place], [place] [address]    Special Requests   Final    BOTTLES DRAWN AEROBIC AND ANAEROBIC Blood Culture adequate volume Performed at [facility], [address], [place], [place] [address]    Culture   Final    NO GROWTH < 24 HOURS Performed at [facility], [address] [place], [place] [address]    Report Status PENDING  Incomplete         Radiology Studies: DG Wrist Complete Left Result Date: 05/20/2024 CLINICAL DATA:  Left wrist pain and swelling for 1 week EXAM: LEFT WRIST - COMPLETE 3+ VIEW COMPARISON:  None Available. FINDINGS: There is no evidence of fracture or dislocation. There is no evidence of arthropathy or other focal bone abnormality. Small linear metallic density is seen in volar subcutaneous tissues of the wrist consistent with foreign body. IMPRESSION: No fracture or dislocation is noted. Small linear metallic density is seen in volar subcutaneous tissues of the wrist consistent with foreign body. Electronically Signed   By: Lynwood Landy Raddle M.D.   On: 05/20/2024 10:30        Scheduled Meds:  ciprofloxacin   750 mg Oral BID   enoxaparin  (LOVENOX ) injection  40 mg Subcutaneous QHS   ketorolac   15 mg Intravenous Q8H   linezolid   600 mg Oral Q12H   Continuous Infusions:   LOS: 0 days    Time spent: 52 minutes spent on 05/21/2024 caring for this patient face-to-face including chart review, ordering labs/tests, documenting, discussion with nursing staff, consultants, updating family and interview/physical exam    Camellia PARAS Jordy Hewins, DO [facility] Available via Epic secure chat 7am-7pm After these hours, please refer to coverage provider listed on [email or web address] 05/21/2024, 11:13 AM   "

## 2024-05-21 NOTE — TOC Initial Note (Signed)
 Transition of Care [facility]) - Initial/Assessment Note    Patient Details  Name: Adrian Schmitt MRN: [id number] Date of Birth: 12-08-1995  [facility] CM/SW Contact:    Alfonse JONELLE Rex, RN Phone Number: 05/21/2024, 11:34 AM  Clinical Narrative:     Met with patient at bedside to introduce role of INPT CM and review for dc planning, patient reports he resides with his significant other, No PCP on file, has insurance on file. TOC consult for Substance Abuse Resources, pt agreeable, added to AVS. Patient reports he has a ride at discharge, states he drove himself to hospital and will drive himself home if of possible or his girlfriend can pick him up. No further CM needs identified at this time.               Expected Discharge Plan: Home/Self Care Barriers to Discharge: Continued Medical Work up   Patient Goals and CMS Choice Patient states their goals for this hospitalization and ongoing recovery are:: return home          Expected Discharge Plan and Services       Living arrangements for the past 2 months: Single Family Home                                      Prior Living Arrangements/Services Living arrangements for the past 2 months: Single Family Home Lives with:: Significant Other Patient language and need for interpreter reviewed:: Yes Do you feel safe going back to the place where you live?: Yes      Need for Family Participation in Patient Care: Yes (Comment) Care giver support system in place?: Yes (comment)   Criminal Activity/Legal Involvement Pertinent to Current Situation/Hospitalization: No - Comment as needed  Activities of Daily Living   ADL Screening (condition at time of admission) Independently performs ADLs?: Yes (appropriate for developmental age) Is the patient deaf or have difficulty hearing?: No Does the patient have difficulty seeing, even when wearing glasses/contacts?: No Does the patient have difficulty concentrating,  remembering, or making decisions?: No  Permission Sought/Granted                  Emotional Assessment Appearance:: Appears stated age Attitude/Demeanor/Rapport: Engaged Affect (typically observed): Accepting Orientation: : Oriented to Self, Oriented to Place, Oriented to  Time, Oriented to Situation Alcohol / Substance Use: Not Applicable Psych Involvement: No (comment)  Admission diagnosis:  Cellulitis of left upper extremity [L03.114] Drug abuse, IV (HCC) [F19.10] Cellulitis of left wrist [L03.114] Patient Active Problem List   Diagnosis Date Noted   Cellulitis of left wrist 05/20/2024   Wart 06/20/2017   PCP:  Patient, No Pcp Per Pharmacy:   [facility] [id number], [place] - [address] [place] [address] Phone: [phone] Fax: [phone]  [id number] - [place] [place], [place] - [address] [facility] [address] [place] [address] Phone: [phone] Fax: [phone]     Social Drivers of Health (SDOH) Social History: SDOH Screenings   Food Insecurity: No Food Insecurity (05/20/2024)  Housing: Low Risk (05/20/2024)  Transportation Needs: No Transportation Needs (05/20/2024)  Utilities: Patient Declined (05/20/2024)  Tobacco Use: Medium Risk (05/21/2024)   SDOH Interventions:     Readmission Risk Interventions     No data to display

## 2024-05-21 NOTE — Consult Note (Signed)
 "        [facility]    Date of Admission:  05/20/2024   Total days of inpatient antibiotics 1        Reason for Consult: Cellulitis    Principal Problem:   Cellulitis of left wrist   Assessment: 29 year old male who injected heroin into his left forearm 2 days prior to presentation admitted for left lower extremity Upper extremity cellulitis. #Left upper extremity cellulitis - He was started on vancomycin  and cefepime .  Orthopedics was engaged x-ray showed forearm metallic body.  Orthopedics recommended continue IV antibiotics, no concern for flexor tenosynovitis, septic joint or abscess. - Seen by ID, left hand/forearm erythema has improved.  He was able to squeeze my fingers.  Plan to transition him to p.o. antibiotics Plan: - DC vancomycin  and cefepime  - Start linezolid  600 mg/ twice daily and ciprofloxacin  750 mg p.o. twice daily to complete 2 weeks antibiotics - Follow-up with infectious disease on -ID will sign off - Plan communicated to primary  Microbiology:   Antibiotics: Cefpeime 1/15- Vancomycin  1/15-  Cultures: Blood 1/15 Urine  Other   HPI: Adrian Schmitt is a 29 y.o. male with past medical history of active IVDU use with heroin presented to ED with left wrist pain and swelling.  Patient had injected into his left forearm about 2 days ago.  He noted that he went to urgent care day prior to admission received Doxy and ceftriaxone .  Swelling began to progress.  On arrival vital stable.  No leukocytosis.X-ray of left wrist showed small linear metallic density in the volar subcutaneous tissue of wrist consistent with foreign body.  Orthopedics was consulted noted continue IV antibiotics.  No concern for flexor tenosynovitis, septic joint or abscess.  ID engaged for antibiotic recommendations.   Review of Systems: Review of Systems  All other systems reviewed and are negative.   Past Medical History:  Diagnosis Date   Polysubstance  abuse (HCC)    Seizure (HCC)    Seizures (HCC)     Social History[1]  History reviewed. No pertinent family history. Scheduled Meds: Continuous Infusions: PRN Meds:. Allergies[2]  OBJECTIVE: Blood pressure 129/75, pulse 71, temperature 98.1 F (36.7 C), temperature source Oral, resp. rate 16, height 6' (1.829 m), weight 62 kg, SpO2 100%.  Physical Exam Constitutional:      General: He is not in acute distress.    Appearance: He is normal weight. He is not toxic-appearing.  HENT:     Head: Normocephalic and atraumatic.     Right Ear: External ear normal.     Left Ear: External ear normal.     Nose: No congestion or rhinorrhea.     Mouth/Throat:     Mouth: Mucous membranes are moist.     Pharynx: Oropharynx is clear.  Eyes:     Extraocular Movements: Extraocular movements intact.     Conjunctiva/sclera: Conjunctivae normal.     Pupils: Pupils are equal, round, and reactive to light.  Cardiovascular:     Rate and Rhythm: Normal rate and regular rhythm.     Heart sounds: No murmur heard.    No friction rub. No gallop.  Pulmonary:     Effort: Pulmonary effort is normal.     Breath sounds: Normal breath sounds.  Abdominal:     General: Abdomen is flat. Bowel sounds are normal.     Palpations: Abdomen is soft.  Musculoskeletal:        General: No swelling.  Cervical back: Normal range of motion and neck supple.     Comments: Left arm erythema mild  Skin:    General: Skin is warm and dry.  Neurological:     General: No focal deficit present.     Mental Status: He is oriented to person, place, and time.  Psychiatric:        Mood and Affect: Mood normal.     Lab Results Lab Results  Component Value Date   WBC 7.5 05/20/2024   HGB 11.6 (L) 05/20/2024   HCT 36.3 (L) 05/20/2024   MCV 86.6 05/20/2024   PLT 238 05/20/2024    Lab Results  Component Value Date   CREATININE 0.66 05/20/2024   BUN 17 05/20/2024   NA 137 05/20/2024   K 4.0 05/20/2024   CL 99  05/20/2024   CO2 26 05/20/2024    Lab Results  Component Value Date   ALT 63 (H) 05/20/2024   AST 77 (H) 05/20/2024   ALKPHOS 179 (H) 05/20/2024   BILITOT 0.4 05/20/2024       Loney Stank, MD [facility] 05/21/2024, 10:32 PM     [1]  Social History Tobacco Use   Smoking status: Former    Types: Cigarettes   Smokeless tobacco: Never  Vaping Use   Vaping status: Every Day  Substance Use Topics   Alcohol use: Yes   Drug use: Yes    Comment: heroin  [2]  Allergies Allergen Reactions   Ativan [Lorazepam]     Does not work, has opposite reaction    Nembutal [Pentobarbital]     Has opposite reaction   "

## 2024-05-21 NOTE — Discharge Summary (Signed)
 " Physician Discharge Summary  Adrian Schmitt FMW:[id number] DOB: [birth year] DOA: 05/20/2024  PCP: Patient, No Pcp Per  Admit date: 05/20/2024 Discharge date: 05/21/2024  Admitted From: Home Disposition: Home  Recommendations for Outpatient Follow-up:  Follow up with PCP in 1-2 weeks Follow-up with infectious disease, Dr. Dennise 2 weeks Continue linezolid  and ciprofloxacin  x 2 weeks per ID recommendation for left upper extremity cellulitis related to active IVDU Continue to counsel need for complete cessation/abstinence from IVDU Follow-up finalized blood cultures that are pending at time of discharge, no growth less than 24 hours  Home Health: No Equipment/Devices: None  Discharge Condition: Stable CODE STATUS: Full code Diet recommendation: Regular diet  History of present illness:  Adrian Schmitt is a 29 y.o. male with past medical history significant for active IVDU with heroin who presented to [facility] ED on 05/20/2023 with left wrist pain, swelling.  Reports last IVDU 2 days ago.  Seen at urgent care yesterday and was given doxycycline  and ceftriaxone  but despite symptoms have progressed and reports significantly painful to touch or with any type of movement.  Does endorse fever.  Denies chills, no nausea/vomiting, no chest pain, no shortness of breath, no abdominal pain.   In the ED, temperature 98.1 F, HR 76, RR 16, BP 154/81, SpO2 99% on room air.  WBC 7.7, hemoglobin 13.6, platelet count 263.  Sodium 137, potassium 4.0, chloride 99, CO2 26, glucose 104, BUN 17, creatinine 0.79.  AST 77, ALT 63, total Ruben 0.4.  Lactic acid 1.0.  Blood cultures x 2 obtained.  Left wrist x-ray with no fracture or dislocation noted, small linear metallic density seen in the volar subcutaneous tissues of the wrist consistent with foreign body.  Orthopedic hand surgery consulted, no recommendations for surgical intervention at this time; and no need for removal of foreign body.  Patient was  started on IV vancomycin , Zosyn  and given 1 dose of Toradol .  TRH consulted for admission for further evaluation and management.  Hospital course:  Left upper extremity cellulitis 2/2 IVDU Retained metallic foreign body Patient presenting to the ED with progressive swelling, erythema, pain to his left hand/wrist, now progressing to his left forearm.  Reports IVDU 2 days prior to admission.  Did see urgent care day prior and was given doxycycline  and ceftriaxone ; symptoms continued to persist.  Patient is afebrile without leukocytosis.  Lactic acid within normal limits.  Left wrist x-ray with no fracture or dislocation, small linear metallic density seen in the volar subcutaneous tissues of the wrist consistent with foreign body.  Seen by Orthopedic hand surgery, Dr. Larayne; given no concern for tenosynovitis, septic joint or abscess; no surgical intervention indicated at this time and no indication to remove foreign body.  Infectious disease was consulted and recommended linezolid  600 mg p.o. every 12 hours, ciprofloxacin  750 g p.o. twice daily x 14 days.  Outpatient follow-up with infectious disease, Dr. Dennise 2 weeks.   IVDU Recent heroin use 2 days prior to admission.  Counseled on need for complete cessation/abstinence.   Discharge Diagnoses:  Principal Problem:   Cellulitis of left wrist    Discharge Instructions  Discharge Instructions     Call MD for:  difficulty breathing, headache or visual disturbances   Complete by: As directed    Call MD for:  extreme fatigue   Complete by: As directed    Call MD for:  persistant dizziness or light-headedness   Complete by: As directed    Call MD for:  persistant nausea and vomiting   Complete by: As directed    Call MD for:  severe uncontrolled pain   Complete by: As directed    Call MD for:  temperature >100.4   Complete by: As directed    Increase activity slowly   Complete by: As directed    No wound care   Complete by: As  directed       Allergies as of 05/21/2024       Reactions   Ativan [lorazepam]    Does not work, has opposite reaction   Nembutal [pentobarbital]    Has opposite reaction        Medication List     STOP taking these medications    doxycycline  100 MG capsule Commonly known as: VIBRAMYCIN        TAKE these medications    ciprofloxacin  750 MG tablet Commonly known as: CIPRO  Take 1 tablet (750 mg total) by mouth 2 (two) times daily for 14 days.   linezolid  600 MG tablet Commonly known as: ZYVOX  Take 1 tablet (600 mg total) by mouth every 12 (twelve) hours for 14 days.        Follow-up Information     Dennise Kingsley, MD. Schedule an appointment as soon as possible for a visit in 2 week(s).   Specialty: Infectious Diseases Contact information: [address] [place] [address] [phone]                Allergies[1]  Consultations: Orthopedic hand surgery, Dr. Larayne; Infectious disease, Dr. Dennise   Procedures/Studies: DG Wrist Complete Left Result Date: 05/20/2024 CLINICAL DATA:  Left wrist pain and swelling for 1 week EXAM: LEFT WRIST - COMPLETE 3+ VIEW COMPARISON:  None Available. FINDINGS: There is no evidence of fracture or dislocation. There is no evidence of arthropathy or other focal bone abnormality. Small linear metallic density is seen in volar subcutaneous tissues of the wrist consistent with foreign body. IMPRESSION: No fracture or dislocation is noted. Small linear metallic density is seen in volar subcutaneous tissues of the wrist consistent with foreign body. Electronically Signed   By: Lynwood Landy Raddle M.D.   On: 05/20/2024 10:30     Subjective: Patient seen and examined at bedside, lying in bed.  Reports pain to left wrist/forearm much improved.  Seen by infectious disease, Dr. Dennise recommends transitioning IV antibiotics to ciprofloxacin  and linezolid  to complete 14-day course.  They will follow-up outpatient in a  few weeks.  Patient with no other questions or concerns at this time.  Denies headache, no dizziness, no chest pain, no palpitation, no shortness of breath, no abdominal pain, no fever/chills/night sweats, no nausea/vomiting/diarrhea, no focal weakness, no fatigue, no paresthesia.  No acute events overnight per nurse staff.  Discharge Exam: Vitals:   05/21/24 0004 05/21/24 0627  BP: 132/81 129/75  Pulse: 78 71  Resp: 17 16  Temp: 97.9 F (36.6 C) 98.1 F (36.7 C)  SpO2: 96% 100%   Vitals:   05/20/24 1802 05/20/24 2212 05/21/24 0004 05/21/24 0627  BP:  (!) 146/81 132/81 129/75  Pulse:  66 78 71  Resp:  16 17 16   Temp:  98.2 F (36.8 C) 97.9 F (36.6 C) 98.1 F (36.7 C)  TempSrc:  Oral Oral Oral  SpO2:  100% 96% 100%  Weight: 62 kg     Height: 6' (1.829 m)       Physical Exam: GEN: NAD, alert and oriented x 3, wd/wn HEENT: NCAT, PERRL,  EOMI, sclera clear, MMM PULM: CTAB w/o wheezes/crackles, normal respiratory effort, on room air CV: RRR w/o M/G/R GI: abd soft, NTND, + BS MSK: Left hand/wrist with edema, erythema extending proximally to mid forearm, slight TTP, noted track marks, no fluctuance, no crepitus, + mild pain with AROM wrist and fingers, as depicted below NEURO: CN II-XII intact, no focal deficits, sensation to light touch intact PSYCH: normal mood/affect Integumentary: Left upper extremity as stated above and depicted below, otherwise no other concerning rashes/lesions/wounds noted on exposed skin surfaces       The results of significant diagnostics from this hospitalization (including imaging, microbiology, ancillary and laboratory) are listed below for reference.     Microbiology: Recent Results (from the past 240 hours)  Culture, blood (routine x 2)     Status: None (Preliminary result)   Collection Time: 05/20/24  9:42 AM   Specimen: BLOOD RIGHT FOREARM  Result Value Ref Range Status   Specimen Description   Final    BLOOD RIGHT FOREARM Performed  at [facility], [address] [place], [place] [address]    Special Requests   Final    BOTTLES DRAWN AEROBIC AND ANAEROBIC Blood Culture results may not be optimal due to an inadequate volume of blood received in culture bottles Performed at [facility], [address] [place], [place] [address]    Culture   Final    NO GROWTH < 24 HOURS Performed at [facility], [address] [place], [place] [address]    Report Status PENDING  Incomplete  Culture, blood (routine x 2)     Status: None (Preliminary result)   Collection Time: 05/20/24 10:06 AM   Specimen: BLOOD RIGHT FOREARM  Result Value Ref Range Status   Specimen Description   Final    BLOOD RIGHT FOREARM Performed at [facility], [address] [place], [place] [address]    Special Requests   Final    BOTTLES DRAWN AEROBIC AND ANAEROBIC Blood Culture adequate volume Performed at [facility], [address], [place], [place] [address]    Culture   Final    NO GROWTH < 24 HOURS Performed at [facility], [address], [place], [place] [address]    Report Status PENDING  Incomplete     Labs: BNP (last 3 results) No results for input(s): BNP in the last 8760 hours. Basic Metabolic Panel: Recent Labs  Lab 05/20/24 1004 05/20/24 1636  NA 137  --   K 4.0  --   CL 99  --   CO2 26  --   GLUCOSE 104*  --   BUN 17  --   CREATININE 0.79 0.66  CALCIUM 9.8  --    Liver Function Tests: Recent Labs  Lab 05/20/24 1004  AST 77*  ALT 63*  ALKPHOS 179*  BILITOT 0.4  PROT 9.1*  ALBUMIN 4.8   No results for input(s): LIPASE, AMYLASE in the last 168 hours. No results for input(s): AMMONIA in the last 168 hours. CBC: Recent Labs  Lab 05/20/24 1004 05/20/24 1636  WBC 7.7 7.5  NEUTROABS 5.0  --   HGB 13.6 11.6*  HCT 41.1 36.3*  MCV 86.2 86.6  PLT 263 238   Cardiac Enzymes: No results for input(s): CKTOTAL, CKMB, CKMBINDEX,  TROPONINI in the last 168 hours. BNP: Invalid input(s): POCBNP CBG: No results for input(s): GLUCAP in the last 168 hours. D-Dimer No results for input(s):  DDIMER in the last 72 hours. Hgb A1c No results for input(s): HGBA1C in the last 72 hours. Lipid Profile No results for input(s): CHOL, HDL, LDLCALC, TRIG, CHOLHDL, LDLDIRECT in the last 72 hours. Thyroid function studies No results for input(s): TSH, T4TOTAL, T3FREE, THYROIDAB in the last 72 hours.  Invalid input(s): FREET3 Anemia work up No results for input(s): VITAMINB12, FOLATE, FERRITIN, TIBC, IRON, RETICCTPCT in the last 72 hours. Urinalysis    Component Value Date/Time   LABSPEC >=1.030 02/27/2019 1738   PHURINE 5.5 02/27/2019 1738   GLUCOSEU NEGATIVE 02/27/2019 1738   HGBUR NEGATIVE 02/27/2019 1738   BILIRUBINUR small (A) 12/03/2022 1523   KETONESUR small (15) (A) 12/03/2022 1523   KETONESUR NEGATIVE 02/27/2019 1738   PROTEINUR =30 (A) 12/03/2022 1523   PROTEINUR NEGATIVE 02/27/2019 1738   UROBILINOGEN 1.0 12/03/2022 1523   UROBILINOGEN 0.2 02/27/2019 1738   NITRITE Negative 12/03/2022 1523   NITRITE NEGATIVE 02/27/2019 1738   LEUKOCYTESUR Negative 12/03/2022 1523   LEUKOCYTESUR TRACE (A) 02/27/2019 1738   Sepsis Labs Recent Labs  Lab 05/20/24 1004 05/20/24 1636  WBC 7.7 7.5   Microbiology Recent Results (from the past 240 hours)  Culture, blood (routine x 2)     Status: None (Preliminary result)   Collection Time: 05/20/24  9:42 AM   Specimen: BLOOD RIGHT FOREARM  Result Value Ref Range Status   Specimen Description   Final    BLOOD RIGHT FOREARM Performed at [facility], [address], [place], [place] [address]    Special Requests   Final    BOTTLES DRAWN AEROBIC AND ANAEROBIC Blood Culture results may not be optimal due to an inadequate volume of blood received in culture bottles Performed at [facility], [address] [place], [place] [address]    Culture   Final    NO GROWTH < 24 HOURS Performed at [facility], [address] [place], [place] [address]    Report Status PENDING  Incomplete  Culture, blood (routine x 2)     Status: None (Preliminary result)   Collection Time: 05/20/24 10:06 AM   Specimen: BLOOD RIGHT FOREARM  Result Value Ref Range Status   Specimen Description   Final    BLOOD RIGHT FOREARM Performed at [facility], [address], [place], [place] [address]    Special Requests   Final    BOTTLES DRAWN AEROBIC AND ANAEROBIC Blood Culture adequate volume Performed at [facility], [address] [place], [place] [address]    Culture   Final    NO GROWTH < 24 HOURS Performed at [facility], [address], [place], [place] [address]    Report Status PENDING  Incomplete     Time coordinating discharge: Over 30 minutes  SIGNED:   Camellia PARAS Kenlee Maler, DO  [facility] 05/21/2024, 2:20 PM     [1]  Allergies Allergen Reactions   Ativan [Lorazepam]     Does not work, has opposite reaction    Nembutal [Pentobarbital]     Has opposite reaction   "

## 2024-05-21 NOTE — Plan of Care (Signed)

## 2024-05-21 NOTE — Telephone Encounter (Signed)
 Pharmacy Patient Advocate Encounter  Insurance verification completed.    The patient is insured through [employer].     Ran test claim for Linezolid  600mg  tablet and the current 14 day co-pay is $4.   This test claim was processed through [employer]- copay amounts may vary at other pharmacies due to [employer], or as the patient moves through the different stages of their insurance plan.

## 2024-05-21 NOTE — Progress Notes (Signed)
 Discharge meds in a secure bag delivered to patient in lobby  by this RN.

## 2024-05-21 NOTE — Progress Notes (Signed)
 Patient discharged to home. Ambulated to pov. Verbalized understanding of instructions.

## 2024-05-25 LAB — CULTURE, BLOOD (ROUTINE X 2)
Culture: NO GROWTH
Culture: NO GROWTH
Special Requests: ADEQUATE

## 2024-06-03 ENCOUNTER — Inpatient Hospital Stay: Payer: MEDICAID | Admitting: Internal Medicine
